# Patient Record
Sex: Female | Born: 2000 | Race: Black or African American | Hispanic: No | Marital: Single | Smoking: Never smoker
Health system: Southern US, Community
[De-identification: ages and names within clinical notes are randomized; demographics above are authoritative.]

## PROBLEM LIST (undated history)

## (undated) DIAGNOSIS — Z8759 Personal history of other complications of pregnancy, childbirth and the puerperium: Secondary | ICD-10-CM

## (undated) DIAGNOSIS — R87629 Unspecified abnormal cytological findings in specimens from vagina: Secondary | ICD-10-CM

## (undated) DIAGNOSIS — O09299 Supervision of pregnancy with other poor reproductive or obstetric history, unspecified trimester: Secondary | ICD-10-CM

## (undated) HISTORY — DX: Supervision of pregnancy with other poor reproductive or obstetric history, unspecified trimester: O09.299

## (undated) HISTORY — DX: Personal history of other complications of pregnancy, childbirth and the puerperium: Z87.59

## (undated) HISTORY — PX: WISDOM TOOTH EXTRACTION: SHX21

---
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
## 2018-05-22 ENCOUNTER — Emergency Department (HOSPITAL_COMMUNITY)
Admission: EM | Admit: 2018-05-22 | Discharge: 2018-05-22 | Disposition: A | Discharge status: Home / Self Care | Payer: Self-pay | Attending: Emergency Medicine | Admitting: Emergency Medicine

## 2018-05-22 ENCOUNTER — Encounter (HOSPITAL_COMMUNITY): Payer: Self-pay | Admitting: Emergency Medicine

## 2018-05-22 DIAGNOSIS — J020 Streptococcal pharyngitis: Secondary | ICD-10-CM | POA: Insufficient documentation

## 2018-05-22 DIAGNOSIS — Z79899 Other long term (current) drug therapy: Secondary | ICD-10-CM | POA: Insufficient documentation

## 2018-05-22 LAB — GROUP A STREP BY PCR: Group A Strep by PCR: DETECTED — AB

## 2018-05-22 MED ORDER — PREDNISONE 20 MG PO TABS: 40.0000 mg | ORAL_TABLET | Freq: Every day | ORAL | 0 refills | Status: AC

## 2018-05-22 MED ORDER — PENICILLIN V POTASSIUM 500 MG PO TABS: 500.0000 mg | ORAL_TABLET | Freq: Two times a day (BID) | ORAL | 0 refills | Status: AC

## 2018-05-22 NOTE — ED Triage Notes (Signed)
Per pt, states tonsils swollen for 2-3 days-has not seen PCP

## 2018-05-22 NOTE — Discharge Instructions (Addendum)
Please take Ibuprofen (Advil, motrin) and Tylenol (acetaminophen) to relieve your pain.  You may take up to 600 MG (3 pills) of normal strength ibuprofen every 8 hours as needed.  In between doses of ibuprofen you make take tylenol, up to 1,000 mg (two extra strength pills).  Do not take more than 3,000 mg tylenol in a 24 hour period.  Please check all medication labels as many medications such as pain and cold medications may contain tylenol.  Do not drink alcohol while taking these medications.  Do not take other NSAID'S while taking ibuprofen (such as aleve or naproxen).  Please take ibuprofen with food to decrease stomach upset.  You may have diarrhea from the antibiotics.  It is very important that you continue to take the antibiotics even if you get diarrhea unless a medical professional tells you that you may stop taking them.  If you stop too early the bacteria you are being treated for will become stronger and you may need different, more powerful antibiotics that have more side effects and worsening diarrhea.  Please stay well hydrated and consider probiotics as they may decrease the severity of your diarrhea.  Please be aware that if you take any hormonal contraception (birth control pills, nexplanon, the ring, etc) that your birth control will not work while you are taking antibiotics and you need to use back up protection as directed on the birth control medication information insert.   I have given you a prescription for steroids today.  Some common side effects include feelings of extra energy, feeling warm, increased appetite, and stomach upset.  If you are diabetic your sugars may run higher than usual.

## 2018-05-22 NOTE — ED Provider Notes (Signed)
[facility]-EMERGENCY DEPT Provider Note   CSN: [id number] Arrival date & time: 05/22/18  1251     History   Chief Complaint Chief Complaint  Patient presents with  . tonsils swollen    HPI Deborah Santiago is a 17 y.o. female who presents today for evlaution of three days of sore throat, and feeling like her tonsils are swollen.  Her temperature this afternoon was 100.2.  She has been taking ibuprofen, last dose yesterday.  Her son has an ear infection.    HPI  History reviewed. No pertinent past medical history.  Patient Active Problem List   Diagnosis Date Noted  . S/P primary low transverse C-section 10/29/2015  . Eclampsia complicating pregnancy in third trimester 10/28/2015    Past Surgical History:  Procedure Laterality Date  . CESAREAN SECTION N/A 10/28/2015   Procedure: CESAREAN SECTION;  Surgeon: Huel CoteKathy Richardson, MD;  [facility];  Service: Obstetrics;  Laterality: N/A;     OB History    Gravida  1   Para  1   Term      Preterm  1   AB      Living  1     SAB      TAB      Ectopic      Multiple  0   Live Births  1            Home Medications    Prior to Admission medications   Medication Sig Start Date End Date Taking? Authorizing Provider  acetaminophen (TYLENOL) 500 MG tablet Take 1,000 mg by mouth every 6 (six) hours as needed for moderate pain.    [provider]  albuterol (PROVENTIL HFA;VENTOLIN HFA) 108 (90 Base) MCG/ACT inhaler Inhale 1-2 puffs into the lungs every 6 (six) hours as needed for wheezing or shortness of breath.    [provider]  cetirizine (ZYRTEC) 10 MG tablet Take 10 mg by mouth daily.    [provider]  ibuprofen (ADVIL) 200 MG tablet Take 3 tablets (600 mg total) by mouth every 6 (six) hours as needed for mild pain. 11/01/15   Huel Coteichardson, Kathy, MD  oxyCODONE (OXY IR/ROXICODONE) 5 MG immediate release tablet Take 1 tablet (5 mg total) by  mouth every 4 (four) hours as needed (pain scale 4-7). 11/01/15   Huel Coteichardson, Kathy, MD  penicillin v potassium (VEETID) 500 MG tablet Take 1 tablet (500 mg total) by mouth 2 (two) times daily for 10 days. 05/22/18 06/01/18  Cristina GongHammond, Loi Rennaker W, PA-C  predniSONE (DELTASONE) 20 MG tablet Take 2 tablets (40 mg total) by mouth daily for 3 days. 05/22/18 05/25/18  Cristina GongHammond, Shavon Zenz W, PA-C  Prenatal Vit-Fe Fumarate-FA (PRENATAL MULTIVITAMIN) TABS tablet Take 1 tablet by mouth daily at 12 noon.    [provider]    Family History No family history on file.  Social History Social History   Tobacco Use  . Smoking status: Never Smoker  Substance Use Topics  . Alcohol use: Yes  . Drug use: No     Allergies   Patient has no known allergies.   Review of Systems Review of Systems  Constitutional: Positive for chills, fatigue and fever.  HENT: Positive for sore throat. Negative for congestion, ear pain, postnasal drip, rhinorrhea, sinus pressure, sinus pain, trouble swallowing and voice change.   Respiratory: Negative for cough, chest tightness and shortness of breath.   Cardiovascular: Negative for chest pain and palpitations.  Musculoskeletal: Negative for  neck pain and neck stiffness.  Neurological: Negative for weakness and headaches.  All other systems reviewed and are negative.    Physical Exam Updated Vital Signs BP 132/74   Pulse 100   Temp 99.4 F (37.4 C) (Oral)   Resp 16   Ht 5\' 3"  (1.6 m)   Wt 68 kg   LMP 05/15/2018   SpO2 98%   BMI 26.57 kg/m   Physical Exam Vitals signs and nursing note reviewed.  Constitutional:      General: She is not in acute distress.    Appearance: She is normal weight. She is not ill-appearing or toxic-appearing.  HENT:     Head: Normocephalic.     Jaw: There is normal jaw occlusion.     Right Ear: Tympanic membrane, ear canal and external ear normal. There is no impacted cerumen.     Left Ear: Tympanic membrane, ear canal and  external ear normal. There is no impacted cerumen.     Mouth/Throat:     Mouth: Mucous membranes are moist.     Palate: No mass and lesions. Palate does not elevate in midline.     Pharynx: Uvula midline. Oropharyngeal exudate and posterior oropharyngeal erythema present. No uvula swelling.     Tonsils: Tonsillar exudate present. No tonsillar abscesses. Swelling: 2+ on the right. 2+ on the left.  Neck:     Musculoskeletal: Normal range of motion.  Cardiovascular:     Rate and Rhythm: Tachycardia present.     Heart sounds: Normal heart sounds.  Pulmonary:     Effort: Pulmonary effort is normal. No respiratory distress.  Lymphadenopathy:     Cervical: Cervical adenopathy present.  Skin:    General: Skin is warm.  Neurological:     General: No focal deficit present.     Mental Status: She is alert.      ED Treatments / Results  Labs (all labs ordered are listed, but only abnormal results are displayed) Labs Reviewed  GROUP A STREP BY PCR - Abnormal; Notable for the following components:      Result Value   Group A Strep by PCR DETECTED (*)    All other components within normal limits    EKG None  Radiology No results found.  Procedures Procedures (including critical care time)  Medications Ordered in ED Medications - No data to display   Initial Impression / Assessment and Plan / ED Course  I have reviewed the triage vital signs and the nursing notes.  Pertinent labs & imaging results that were available during my care of the patient were reviewed by me and considered in my medical decision making (see chart for details).    Pt febrile with tonsillar exudate, cervical lymphadenopathy, & dysphagia; diagnosis of strep.  Pt appears mildly dehydrated, discussed importance of water rehydration. Presentation non concerning for PTA or infxn spread to soft tissue. No trismus or uvula deviation. Specific return precautions discussed. Pt able to drink water in ED without  difficulty with intact air way. Recommended PCP follow up.    Final Clinical Impressions(s) / ED Diagnoses   Final diagnoses:  Strep throat    ED Discharge Orders         Ordered    penicillin v potassium (VEETID) 500 MG tablet  2 times daily     05/22/18 1740    predniSONE (DELTASONE) 20 MG tablet  Daily     05/22/18 1740           Jeraldine Loots,  Earnstine Regallizabeth W, PA-C 05/22/18 1746    Azalia Bilisampos, Kevin, MD 05/22/18 2340

## 2018-10-12 ENCOUNTER — Emergency Department (HOSPITAL_COMMUNITY): Payer: Medicaid Other

## 2018-10-12 ENCOUNTER — Encounter (HOSPITAL_COMMUNITY): Payer: Self-pay

## 2018-10-12 ENCOUNTER — Emergency Department (HOSPITAL_COMMUNITY)
Admission: EM | Admit: 2018-10-12 | Discharge: 2018-10-12 | Disposition: A | Discharge status: Home / Self Care | Payer: Medicaid Other | Attending: Emergency Medicine | Admitting: Emergency Medicine

## 2018-10-12 ENCOUNTER — Other Ambulatory Visit: Payer: Self-pay

## 2018-10-12 DIAGNOSIS — Y9389 Activity, other specified: Secondary | ICD-10-CM | POA: Insufficient documentation

## 2018-10-12 DIAGNOSIS — Y929 Unspecified place or not applicable: Secondary | ICD-10-CM | POA: Diagnosis not present

## 2018-10-12 DIAGNOSIS — W010XXA Fall on same level from slipping, tripping and stumbling without subsequent striking against object, initial encounter: Secondary | ICD-10-CM | POA: Diagnosis not present

## 2018-10-12 DIAGNOSIS — Y999 Unspecified external cause status: Secondary | ICD-10-CM | POA: Diagnosis not present

## 2018-10-12 DIAGNOSIS — S6991XA Unspecified injury of right wrist, hand and finger(s), initial encounter: Secondary | ICD-10-CM | POA: Diagnosis present

## 2018-10-12 DIAGNOSIS — Z79899 Other long term (current) drug therapy: Secondary | ICD-10-CM | POA: Diagnosis not present

## 2018-10-12 NOTE — ED Provider Notes (Signed)
[facility]   CSN: [id number] Arrival date & time: 10/12/18  1131    History   Chief Complaint Chief Complaint  Patient presents with  . thumb injury    HPI Deborah Santiago is a 18 y.o. female presenting to the emergency department with right thumb injury that occurred about 5 days ago.  Patient states she fell and her right thumb bent in the dorsal direction.  She has pain to the dorsal aspect of the right first MCP joint that is worse with finger opposition.  She took over-the-counter pain medication on that first day, however has not treated her symptoms since then.  No swelling noted.  No wounds.  Right-hand-dominant.     The history is provided by the patient.    History reviewed. No pertinent past medical history.  Patient Active Problem List   Diagnosis Date Noted  . S/P primary low transverse C-section 10/29/2015  . Eclampsia complicating pregnancy in third trimester 10/28/2015    Past Surgical History:  Procedure Laterality Date  . CESAREAN SECTION N/A 10/28/2015   Procedure: CESAREAN SECTION;  Surgeon: Huel CoteKathy Richardson, MD;  [facility];  Service: Obstetrics;  Laterality: N/A;     OB History    Gravida  1   Para  1   Term      Preterm  1   AB      Living  1     SAB      TAB      Ectopic      Multiple  0   Live Births  1            Home Medications    Prior to Admission medications   Medication Sig Start Date End Date Taking? Authorizing Provider  acetaminophen (TYLENOL) 500 MG tablet Take 1,000 mg by mouth every 6 (six) hours as needed for moderate pain.    [provider]  albuterol (PROVENTIL HFA;VENTOLIN HFA) 108 (90 Base) MCG/ACT inhaler Inhale 1-2 puffs into the lungs every 6 (six) hours as needed for wheezing or shortness of breath.    [provider]  cetirizine (ZYRTEC) 10 MG tablet Take 10 mg by mouth daily.    [provider]   ibuprofen (ADVIL) 200 MG tablet Take 3 tablets (600 mg total) by mouth every 6 (six) hours as needed for mild pain. 11/01/15   Huel Coteichardson, Kathy, MD  KURVELO 0.15-30 MG-MCG tablet Take 1 tablet by mouth daily. 09/08/18   [provider]  oxyCODONE (OXY IR/ROXICODONE) 5 MG immediate release tablet Take 1 tablet (5 mg total) by mouth every 4 (four) hours as needed (pain scale 4-7). 11/01/15   Huel Coteichardson, Kathy, MD  Prenatal Vit-Fe Fumarate-FA (PRENATAL MULTIVITAMIN) TABS tablet Take 1 tablet by mouth daily at 12 noon.    [provider]    Family History Family History  Problem Relation Age of Onset  . Hypertension Mother     Social History Social History   Tobacco Use  . Smoking status: Never Smoker  . Smokeless tobacco: Never Used  Substance Use Topics  . Alcohol use: Yes  . Drug use: No     Allergies   Patient has no known allergies.   Review of Systems Review of Systems  Musculoskeletal: Positive for arthralgias.  Skin: Negative for wound.     Physical Exam Updated Vital Signs BP 126/81 (BP Location: Left Arm)   Pulse 80   Temp 98.9 F (37.2  C) (Oral)   Resp 16   Ht 5\' 3"  (1.6 m)   Wt 65.8 kg   LMP 10/11/2018 (Exact Date)   SpO2 100%   BMI 25.69 kg/m   Physical Exam Vitals signs and nursing note reviewed.  Constitutional:      General: She is not in acute distress.    Appearance: She is well-developed.  HENT:     Head: Normocephalic and atraumatic.  Eyes:     Conjunctiva/sclera: Conjunctivae normal.  Cardiovascular:     Rate and Rhythm: Normal rate.  Pulmonary:     Effort: Pulmonary effort is normal.  Musculoskeletal:     Comments: Right thumb without swelling, bruising, or deformity.  There is some tenderness over the dorsal right first MCP joint.  Full active normal range of motion.  Normal sensation and cap refill.  No anatomical snuffbox tenderness.  Wrist is with normal range of motion.  Neurological:     Mental Status: She is  alert.  Psychiatric:        Mood and Affect: Mood normal.        Behavior: Behavior normal.      ED Treatments / Results  Labs (all labs ordered are listed, but only abnormal results are displayed) Labs Reviewed - No data to display  EKG None  Radiology Dg Finger Thumb Right  Result Date: 10/12/2018 CLINICAL DATA:  Right thumb pain after a fall last week in. EXAM: RIGHT THUMB 2+V COMPARISON:  None. FINDINGS: There is no evidence of fracture or dislocation. There is no evidence of arthropathy or other focal bone abnormality. Soft tissues are unremarkable IMPRESSION: Negative. Electronically Signed   By: Sebastian Ache M.D.   On: 10/12/2018 12:20    Procedures Procedures (including critical care time)  Medications Ordered in ED Medications - No data to display   Initial Impression / Assessment and Plan / ED Course  I have reviewed the triage vital signs and the nursing notes.  Pertinent labs & imaging results that were available during my care of the patient were reviewed by me and considered in my medical decision making (see chart for details).        Patient with right thumb injury after mechanical fall about 5 days ago.  Likely sprain versus strain.  No fracture evident on x-ray.  Will splint for comfort and protection.  Instructed RICE therapy.  Follow-up as needed symptoms persist.  Patient agreeable to plan is a for discharge.  Discussed results, findings, treatment and follow up. Patient advised of return precautions. Patient verbalized understanding and agreed with plan.   Final Clinical Impressions(s) / ED Diagnoses   Final diagnoses:  Thumb injury, right, initial encounter    ED Discharge Orders    None       , [place] N, PA-C 10/12/18 1237    Gerhard Munch, MD 10/13/18 [phone]

## 2018-10-12 NOTE — Discharge Instructions (Addendum)
Please read instructions below. Apply ice to your thumb for 20 minutes at a time. You can take ibuprofen every 6 hours as needed for pain. Schedule an appointment with the sports medicine doctor in 2 weeks if symptoms do not improve.  Return to the ER for new or concerning symptoms.

## 2018-10-12 NOTE — ED Notes (Signed)
Patient transported to X-ray 

## 2018-10-12 NOTE — ED Triage Notes (Signed)
Patient states she "fell on her right thumb last weekend and the thumb bent all the way back."

## 2019-01-23 LAB — OB RESULTS CONSOLE ANTIBODY SCREEN: Antibody Screen: NEGATIVE

## 2019-01-23 LAB — OB RESULTS CONSOLE HIV ANTIBODY (ROUTINE TESTING): HIV: NONREACTIVE

## 2019-01-23 LAB — OB RESULTS CONSOLE ABO/RH: RH Type: POSITIVE

## 2019-01-23 LAB — OB RESULTS CONSOLE RPR: RPR: NONREACTIVE

## 2019-01-23 LAB — OB RESULTS CONSOLE RUBELLA ANTIBODY, IGM: Rubella: IMMUNE

## 2019-01-23 LAB — OB RESULTS CONSOLE GC/CHLAMYDIA
Chlamydia: NEGATIVE
Gonorrhea: NEGATIVE

## 2019-01-23 LAB — OB RESULTS CONSOLE HEPATITIS B SURFACE ANTIGEN: Hepatitis B Surface Ag: NEGATIVE

## 2019-05-28 ENCOUNTER — Ambulatory Visit: Payer: Medicaid Other | Attending: Internal Medicine

## 2019-05-28 DIAGNOSIS — Z20822 Contact with and (suspected) exposure to covid-19: Secondary | ICD-10-CM

## 2019-05-29 LAB — NOVEL CORONAVIRUS, NAA: SARS-CoV-2, NAA: NOT DETECTED

## 2019-07-04 LAB — OB RESULTS CONSOLE GBS: GBS: POSITIVE

## 2019-08-02 ENCOUNTER — Encounter (HOSPITAL_COMMUNITY): Payer: Self-pay | Admitting: *Deleted

## 2019-08-02 ENCOUNTER — Telehealth (HOSPITAL_COMMUNITY): Payer: Self-pay | Admitting: *Deleted

## 2019-08-02 NOTE — Telephone Encounter (Signed)
Preadmission screen  

## 2019-08-08 ENCOUNTER — Other Ambulatory Visit (HOSPITAL_COMMUNITY)
Admission: RE | Admit: 2019-08-08 | Discharge: 2019-08-08 | Disposition: A | Discharge status: Home / Self Care | Payer: Medicaid Other | Source: Ambulatory Visit | Attending: Family Medicine | Admitting: Family Medicine

## 2019-08-08 DIAGNOSIS — Z01812 Encounter for preprocedural laboratory examination: Secondary | ICD-10-CM | POA: Diagnosis not present

## 2019-08-08 DIAGNOSIS — Z20822 Contact with and (suspected) exposure to covid-19: Secondary | ICD-10-CM | POA: Insufficient documentation

## 2019-08-08 LAB — SARS CORONAVIRUS 2 (TAT 6-24 HRS): SARS Coronavirus 2: NEGATIVE

## 2019-08-10 ENCOUNTER — Inpatient Hospital Stay (HOSPITAL_COMMUNITY)
Admission: AD | Admit: 2019-08-10 | Discharge: 2019-08-13 | DRG: 788 | Disposition: A | Discharge status: Home / Self Care | Payer: Medicaid Other | Attending: Obstetrics & Gynecology | Admitting: Obstetrics & Gynecology

## 2019-08-10 ENCOUNTER — Inpatient Hospital Stay (HOSPITAL_COMMUNITY): Payer: Medicaid Other

## 2019-08-10 ENCOUNTER — Other Ambulatory Visit: Payer: Self-pay | Admitting: Obstetrics & Gynecology

## 2019-08-10 ENCOUNTER — Other Ambulatory Visit: Payer: Self-pay

## 2019-08-10 ENCOUNTER — Inpatient Hospital Stay (HOSPITAL_COMMUNITY): Payer: Medicaid Other | Admitting: Anesthesiology

## 2019-08-10 ENCOUNTER — Encounter (HOSPITAL_COMMUNITY)
Admission: AD | Disposition: A | Discharge status: Home / Self Care | Payer: Self-pay | Source: Home / Self Care | Attending: Obstetrics & Gynecology

## 2019-08-10 ENCOUNTER — Encounter (HOSPITAL_COMMUNITY): Payer: Self-pay | Admitting: Obstetrics & Gynecology

## 2019-08-10 DIAGNOSIS — O480 Post-term pregnancy: Secondary | ICD-10-CM | POA: Diagnosis present

## 2019-08-10 DIAGNOSIS — O323XX0 Maternal care for face, brow and chin presentation, not applicable or unspecified: Secondary | ICD-10-CM | POA: Diagnosis present

## 2019-08-10 DIAGNOSIS — Z3490 Encounter for supervision of normal pregnancy, unspecified, unspecified trimester: Secondary | ICD-10-CM

## 2019-08-10 DIAGNOSIS — O34211 Maternal care for low transverse scar from previous cesarean delivery: Secondary | ICD-10-CM | POA: Diagnosis present

## 2019-08-10 DIAGNOSIS — Z880 Allergy status to penicillin: Secondary | ICD-10-CM

## 2019-08-10 DIAGNOSIS — Z3A41 41 weeks gestation of pregnancy: Secondary | ICD-10-CM

## 2019-08-10 DIAGNOSIS — O99824 Streptococcus B carrier state complicating childbirth: Secondary | ICD-10-CM | POA: Diagnosis present

## 2019-08-10 DIAGNOSIS — O9902 Anemia complicating childbirth: Secondary | ICD-10-CM | POA: Diagnosis present

## 2019-08-10 DIAGNOSIS — D649 Anemia, unspecified: Secondary | ICD-10-CM | POA: Diagnosis present

## 2019-08-10 HISTORY — DX: Encounter for supervision of normal pregnancy, unspecified, unspecified trimester: Z34.90

## 2019-08-10 LAB — RPR: RPR Ser Ql: NONREACTIVE

## 2019-08-10 LAB — CBC
HCT: 38.4 % (ref 36.0–46.0)
Hemoglobin: 12.3 g/dL (ref 12.0–15.0)
MCH: 24.8 pg — ABNORMAL LOW (ref 26.0–34.0)
MCHC: 32 g/dL (ref 30.0–36.0)
MCV: 77.6 fL — ABNORMAL LOW (ref 80.0–100.0)
Platelets: 256 10*3/uL (ref 150–400)
RBC: 4.95 MIL/uL (ref 3.87–5.11)
RDW: 16.2 % — ABNORMAL HIGH (ref 11.5–15.5)
WBC: 7.3 10*3/uL (ref 4.0–10.5)
nRBC: 0 % (ref 0.0–0.2)

## 2019-08-10 LAB — TYPE AND SCREEN
ABO/RH(D): B POS
Antibody Screen: NEGATIVE

## 2019-08-10 LAB — ABO/RH: ABO/RH(D): B POS

## 2019-08-10 SURGERY — Surgical Case
Anesthesia: Epidural | Site: Abdomen | Wound class: Clean Contaminated

## 2019-08-10 MED ORDER — ACETAMINOPHEN 325 MG PO TABS: 325.0000 mg | ORAL_TABLET | ORAL | Status: DC | PRN

## 2019-08-10 MED ORDER — SOD CITRATE-CITRIC ACID 500-334 MG/5ML PO SOLN: 30.0000 mL | ORAL | Status: DC

## 2019-08-10 MED ORDER — OXYTOCIN 40 UNITS IN NORMAL SALINE INFUSION - SIMPLE MED: 1.0000 m[IU]/min | INTRAVENOUS | Status: DC

## 2019-08-10 MED ORDER — FENTANYL CITRATE (PF) 100 MCG/2ML IJ SOLN: 25.0000 ug | INTRAMUSCULAR | Status: DC | PRN

## 2019-08-10 MED ORDER — LIDOCAINE-EPINEPHRINE (PF) 2 %-1:200000 IJ SOLN
INTRAMUSCULAR | Status: AC
  Filled 2019-08-10: qty 10

## 2019-08-10 MED ORDER — PHENYLEPHRINE 40 MCG/ML (10ML) SYRINGE FOR IV PUSH (FOR BLOOD PRESSURE SUPPORT): 80.0000 ug | PREFILLED_SYRINGE | INTRAVENOUS | Status: DC | PRN

## 2019-08-10 MED ORDER — LIDOCAINE-EPINEPHRINE (PF) 2 %-1:200000 IJ SOLN
INTRAMUSCULAR | Status: DC | PRN
  Administered 2019-08-10: 10 mL via EPIDURAL

## 2019-08-10 MED ORDER — DIPHENHYDRAMINE HCL 50 MG/ML IJ SOLN: 12.5000 mg | INTRAMUSCULAR | Status: DC | PRN

## 2019-08-10 MED ORDER — OXYTOCIN BOLUS FROM INFUSION: 500.0000 mL | Freq: Once | INTRAVENOUS | Status: DC

## 2019-08-10 MED ORDER — DEXAMETHASONE SODIUM PHOSPHATE 10 MG/ML IJ SOLN
INTRAMUSCULAR | Status: AC
  Filled 2019-08-10: qty 1

## 2019-08-10 MED ORDER — OXYTOCIN 40 UNITS IN NORMAL SALINE INFUSION - SIMPLE MED
INTRAVENOUS | Status: DC | PRN
  Administered 2019-08-10: 200 mL via INTRAVENOUS

## 2019-08-10 MED ORDER — SOD CITRATE-CITRIC ACID 500-334 MG/5ML PO SOLN
30.0000 mL | ORAL | Status: DC | PRN
  Filled 2019-08-10: qty 30

## 2019-08-10 MED ORDER — MEPERIDINE HCL 25 MG/ML IJ SOLN: 6.2500 mg | INTRAMUSCULAR | Status: DC | PRN

## 2019-08-10 MED ORDER — LIDOCAINE HCL (PF) 1 % IJ SOLN: 30.0000 mL | INTRAMUSCULAR | Status: DC | PRN

## 2019-08-10 MED ORDER — SODIUM CHLORIDE (PF) 0.9 % IJ SOLN
INTRAMUSCULAR | Status: DC | PRN
  Administered 2019-08-10: 12 mL/h via EPIDURAL

## 2019-08-10 MED ORDER — FENTANYL CITRATE (PF) 100 MCG/2ML IJ SOLN
INTRAMUSCULAR | Status: DC | PRN
  Administered 2019-08-10: 100 ug via EPIDURAL

## 2019-08-10 MED ORDER — CEFAZOLIN SODIUM-DEXTROSE 2-4 GM/100ML-% IV SOLN
2.0000 g | INTRAVENOUS | Status: AC
  Administered 2019-08-10: 2 g via INTRAVENOUS

## 2019-08-10 MED ORDER — EPHEDRINE 5 MG/ML INJ: 10.0000 mg | INTRAVENOUS | Status: DC | PRN

## 2019-08-10 MED ORDER — DIBUCAINE (PERIANAL) 1 % EX OINT: 1.0000 "application " | TOPICAL_OINTMENT | CUTANEOUS | Status: DC | PRN

## 2019-08-10 MED ORDER — ONDANSETRON HCL 4 MG/2ML IJ SOLN: 4.0000 mg | Freq: Once | INTRAMUSCULAR | Status: DC | PRN

## 2019-08-10 MED ORDER — OXYCODONE-ACETAMINOPHEN 5-325 MG PO TABS: 1.0000 | ORAL_TABLET | ORAL | Status: DC | PRN

## 2019-08-10 MED ORDER — FENTANYL-BUPIVACAINE-NACL 0.5-0.125-0.9 MG/250ML-% EP SOLN
12.0000 mL/h | EPIDURAL | Status: DC | PRN
  Filled 2019-08-10: qty 250

## 2019-08-10 MED ORDER — OXYCODONE HCL 5 MG PO TABS: 5.0000 mg | ORAL_TABLET | Freq: Once | ORAL | Status: DC | PRN

## 2019-08-10 MED ORDER — DIPHENHYDRAMINE HCL 25 MG PO CAPS: 25.0000 mg | ORAL_CAPSULE | Freq: Four times a day (QID) | ORAL | Status: DC | PRN

## 2019-08-10 MED ORDER — TERBUTALINE SULFATE 1 MG/ML IJ SOLN: 0.2500 mg | Freq: Once | INTRAMUSCULAR | Status: DC | PRN

## 2019-08-10 MED ORDER — LACTATED RINGERS IV SOLN: INTRAVENOUS | Status: DC

## 2019-08-10 MED ORDER — MENTHOL 3 MG MT LOZG: 1.0000 | LOZENGE | OROMUCOSAL | Status: DC | PRN

## 2019-08-10 MED ORDER — ACETAMINOPHEN 325 MG PO TABS: 650.0000 mg | ORAL_TABLET | ORAL | Status: DC | PRN

## 2019-08-10 MED ORDER — ACETAMINOPHEN 325 MG PO TABS
650.0000 mg | ORAL_TABLET | ORAL | Status: DC | PRN
  Administered 2019-08-13: 650 mg via ORAL
  Filled 2019-08-10: qty 2

## 2019-08-10 MED ORDER — ONDANSETRON HCL 4 MG/2ML IJ SOLN
INTRAMUSCULAR | Status: DC | PRN
  Administered 2019-08-10: 4 mg via INTRAVENOUS

## 2019-08-10 MED ORDER — PHENYLEPHRINE 40 MCG/ML (10ML) SYRINGE FOR IV PUSH (FOR BLOOD PRESSURE SUPPORT)
80.0000 ug | PREFILLED_SYRINGE | INTRAVENOUS | Status: DC | PRN
  Filled 2019-08-10: qty 10

## 2019-08-10 MED ORDER — OXYCODONE HCL 5 MG PO TABS
5.0000 mg | ORAL_TABLET | ORAL | Status: DC | PRN
  Administered 2019-08-12 – 2019-08-13 (×6): 5 mg via ORAL
  Filled 2019-08-10 (×6): qty 1

## 2019-08-10 MED ORDER — OXYTOCIN 40 UNITS IN NORMAL SALINE INFUSION - SIMPLE MED
1.0000 m[IU]/min | INTRAVENOUS | Status: DC
  Administered 2019-08-10: 3 m[IU]/min via INTRAVENOUS
  Administered 2019-08-10: 4 m[IU]/min via INTRAVENOUS
  Administered 2019-08-10: 2 m[IU]/min via INTRAVENOUS
  Administered 2019-08-10: 3 m[IU]/min via INTRAVENOUS
  Administered 2019-08-10: 2 m[IU]/min via INTRAVENOUS

## 2019-08-10 MED ORDER — SENNOSIDES-DOCUSATE SODIUM 8.6-50 MG PO TABS
2.0000 | ORAL_TABLET | ORAL | Status: DC
  Administered 2019-08-12 (×2): 2 via ORAL
  Filled 2019-08-10 (×3): qty 2

## 2019-08-10 MED ORDER — OXYTOCIN 40 UNITS IN NORMAL SALINE INFUSION - SIMPLE MED
1.0000 m[IU]/min | INTRAVENOUS | Status: DC
  Administered 2019-08-10: 1 m[IU]/min via INTRAVENOUS
  Filled 2019-08-10: qty 1000

## 2019-08-10 MED ORDER — OXYCODONE HCL 5 MG/5ML PO SOLN: 5.0000 mg | Freq: Once | ORAL | Status: DC | PRN

## 2019-08-10 MED ORDER — CEFAZOLIN SODIUM-DEXTROSE 2-4 GM/100ML-% IV SOLN
INTRAVENOUS | Status: AC
  Filled 2019-08-10: qty 100

## 2019-08-10 MED ORDER — KETOROLAC TROMETHAMINE 30 MG/ML IJ SOLN
INTRAMUSCULAR | Status: AC
  Filled 2019-08-10: qty 1

## 2019-08-10 MED ORDER — OXYTOCIN 40 UNITS IN NORMAL SALINE INFUSION - SIMPLE MED: 2.5000 [IU]/h | INTRAVENOUS | Status: AC

## 2019-08-10 MED ORDER — SIMETHICONE 80 MG PO CHEW: 80.0000 mg | CHEWABLE_TABLET | ORAL | Status: DC | PRN

## 2019-08-10 MED ORDER — WITCH HAZEL-GLYCERIN EX PADS: 1.0000 "application " | MEDICATED_PAD | CUTANEOUS | Status: DC | PRN

## 2019-08-10 MED ORDER — PRENATAL MULTIVITAMIN CH
1.0000 | ORAL_TABLET | Freq: Every day | ORAL | Status: DC
  Administered 2019-08-11 – 2019-08-12 (×2): 1 via ORAL
  Filled 2019-08-10 (×2): qty 1

## 2019-08-10 MED ORDER — BUPIVACAINE HCL 0.5 % IJ SOLN
INTRAMUSCULAR | Status: DC | PRN
  Administered 2019-08-10: 18 mL

## 2019-08-10 MED ORDER — COCONUT OIL OIL
1.0000 "application " | TOPICAL_OIL | Status: DC | PRN
  Administered 2019-08-13: 1 via TOPICAL

## 2019-08-10 MED ORDER — BUPIVACAINE HCL (PF) 0.5 % IJ SOLN
INTRAMUSCULAR | Status: AC
  Filled 2019-08-10: qty 30

## 2019-08-10 MED ORDER — MORPHINE SULFATE (PF) 0.5 MG/ML IJ SOLN
INTRAMUSCULAR | Status: DC | PRN
  Administered 2019-08-10: 3 mg via EPIDURAL

## 2019-08-10 MED ORDER — TETANUS-DIPHTH-ACELL PERTUSSIS 5-2.5-18.5 LF-MCG/0.5 IM SUSP: 0.5000 mL | Freq: Once | INTRAMUSCULAR | Status: DC

## 2019-08-10 MED ORDER — OXYTOCIN 40 UNITS IN NORMAL SALINE INFUSION - SIMPLE MED: 2.5000 [IU]/h | INTRAVENOUS | Status: DC

## 2019-08-10 MED ORDER — IBUPROFEN 600 MG PO TABS
600.0000 mg | ORAL_TABLET | Freq: Four times a day (QID) | ORAL | Status: DC
  Administered 2019-08-11 – 2019-08-13 (×9): 600 mg via ORAL
  Filled 2019-08-10 (×9): qty 1

## 2019-08-10 MED ORDER — DEXAMETHASONE SODIUM PHOSPHATE 10 MG/ML IJ SOLN
INTRAMUSCULAR | Status: DC | PRN
  Administered 2019-08-10: 10 mg via INTRAVENOUS

## 2019-08-10 MED ORDER — SIMETHICONE 80 MG PO CHEW
80.0000 mg | CHEWABLE_TABLET | ORAL | Status: DC
  Administered 2019-08-12 (×2): 80 mg via ORAL
  Filled 2019-08-10 (×3): qty 1

## 2019-08-10 MED ORDER — OXYCODONE-ACETAMINOPHEN 5-325 MG PO TABS: 2.0000 | ORAL_TABLET | ORAL | Status: DC | PRN

## 2019-08-10 MED ORDER — LACTATED RINGERS IV SOLN: 500.0000 mL | Freq: Once | INTRAVENOUS | Status: DC

## 2019-08-10 MED ORDER — SIMETHICONE 80 MG PO CHEW
80.0000 mg | CHEWABLE_TABLET | Freq: Three times a day (TID) | ORAL | Status: DC
  Administered 2019-08-11 – 2019-08-13 (×7): 80 mg via ORAL
  Filled 2019-08-10 (×7): qty 1

## 2019-08-10 MED ORDER — FENTANYL CITRATE (PF) 100 MCG/2ML IJ SOLN
INTRAMUSCULAR | Status: AC
  Filled 2019-08-10: qty 2

## 2019-08-10 MED ORDER — ONDANSETRON HCL 4 MG/2ML IJ SOLN
INTRAMUSCULAR | Status: AC
  Filled 2019-08-10: qty 2

## 2019-08-10 MED ORDER — LIDOCAINE HCL (PF) 1 % IJ SOLN
INTRAMUSCULAR | Status: DC | PRN
  Administered 2019-08-10: 5 mL via EPIDURAL
  Administered 2019-08-10: 3 mL via EPIDURAL
  Administered 2019-08-10: 2 mL via EPIDURAL

## 2019-08-10 MED ORDER — SODIUM CHLORIDE 0.9 % IV SOLN
5.0000 10*6.[IU] | Freq: Once | INTRAVENOUS | Status: AC
  Administered 2019-08-10: 5 10*6.[IU] via INTRAVENOUS
  Filled 2019-08-10: qty 5

## 2019-08-10 MED ORDER — PENICILLIN G POT IN DEXTROSE 60000 UNIT/ML IV SOLN
3.0000 10*6.[IU] | INTRAVENOUS | Status: DC
  Administered 2019-08-10 (×2): 3 10*6.[IU] via INTRAVENOUS
  Filled 2019-08-10 (×2): qty 50

## 2019-08-10 MED ORDER — ONDANSETRON HCL 4 MG/2ML IJ SOLN: 4.0000 mg | Freq: Four times a day (QID) | INTRAMUSCULAR | Status: DC | PRN

## 2019-08-10 MED ORDER — ACETAMINOPHEN 160 MG/5ML PO SOLN: 325.0000 mg | ORAL | Status: DC | PRN

## 2019-08-10 MED ORDER — LACTATED RINGERS IV SOLN: 500.0000 mL | INTRAVENOUS | Status: DC | PRN

## 2019-08-10 MED ORDER — KETOROLAC TROMETHAMINE 30 MG/ML IJ SOLN
30.0000 mg | Freq: Once | INTRAMUSCULAR | Status: AC
  Administered 2019-08-10: 30 mg via INTRAVENOUS

## 2019-08-10 MED ORDER — ZOLPIDEM TARTRATE 5 MG PO TABS: 5.0000 mg | ORAL_TABLET | Freq: Every evening | ORAL | Status: DC | PRN

## 2019-08-10 MED ORDER — MORPHINE SULFATE (PF) 0.5 MG/ML IJ SOLN
INTRAMUSCULAR | Status: AC
  Filled 2019-08-10: qty 10

## 2019-08-10 SURGICAL SUPPLY — 39 items
BENZOIN TINCTURE PRP APPL 2/3 (GAUZE/BANDAGES/DRESSINGS) ×3 IMPLANT
CHLORAPREP W/TINT 26ML (MISCELLANEOUS) ×3 IMPLANT
CLAMP CORD UMBIL (MISCELLANEOUS) IMPLANT
CLOSURE WOUND 1/2 X4 (GAUZE/BANDAGES/DRESSINGS) ×1
CLOTH BEACON ORANGE TIMEOUT ST (SAFETY) ×3 IMPLANT
DRSG OPSITE POSTOP 4X10 (GAUZE/BANDAGES/DRESSINGS) ×3 IMPLANT
ELECT REM PT RETURN 9FT ADLT (ELECTROSURGICAL) ×3
ELECTRODE REM PT RTRN 9FT ADLT (ELECTROSURGICAL) ×1 IMPLANT
EXTRACTOR VACUUM KIWI (MISCELLANEOUS) IMPLANT
GLOVE BIO SURGEON STRL SZ 6.5 (GLOVE) ×2 IMPLANT
GLOVE BIO SURGEONS STRL SZ 6.5 (GLOVE) ×1
GLOVE BIOGEL PI IND STRL 7.0 (GLOVE) ×2 IMPLANT
GLOVE BIOGEL PI INDICATOR 7.0 (GLOVE) ×12
GOWN STRL REUS W/TWL LRG LVL3 (GOWN DISPOSABLE) ×9 IMPLANT
KIT ABG SYR 3ML LUER SLIP (SYRINGE) ×2 IMPLANT
NDL HYPO 25X5/8 SAFETYGLIDE (NEEDLE) IMPLANT
NEEDLE HYPO 22GX1.5 SAFETY (NEEDLE) IMPLANT
NEEDLE HYPO 25X5/8 SAFETYGLIDE (NEEDLE) ×3 IMPLANT
NS IRRIG 1000ML POUR BTL (IV SOLUTION) ×3 IMPLANT
PACK C SECTION WH (CUSTOM PROCEDURE TRAY) ×3 IMPLANT
PAD ABD 8X10 STRL (GAUZE/BANDAGES/DRESSINGS) ×2 IMPLANT
PAD OB MATERNITY 4.3X12.25 (PERSONAL CARE ITEMS) ×3 IMPLANT
PENCIL SMOKE EVAC W/HOLSTER (ELECTROSURGICAL) ×3 IMPLANT
RTRCTR C-SECT PINK 25CM LRG (MISCELLANEOUS) ×3 IMPLANT
SPONGE GAUZE 4X4 12PLY (GAUZE/BANDAGES/DRESSINGS) ×2 IMPLANT
STRIP CLOSURE SKIN 1/2X4 (GAUZE/BANDAGES/DRESSINGS) ×2 IMPLANT
SUT CHROMIC 2 0 CT 1 (SUTURE) ×6 IMPLANT
SUT MNCRL 0 VIOLET CTX 36 (SUTURE) ×2 IMPLANT
SUT MONOCRYL 0 CTX 36 (SUTURE) ×4
SUT PDS AB 0 CTX 60 (SUTURE) ×3 IMPLANT
SUT PLAIN 2 0 (SUTURE) ×2
SUT PLAIN ABS 2-0 CT1 27XMFL (SUTURE) IMPLANT
SUT VIC AB 0 CTX 36 (SUTURE) ×4
SUT VIC AB 0 CTX36XBRD ANBCTRL (SUTURE) ×2 IMPLANT
SUT VIC AB 4-0 KS 27 (SUTURE) ×3 IMPLANT
SYR CONTROL 10ML LL (SYRINGE) IMPLANT
TOWEL OR 17X24 6PK STRL BLUE (TOWEL DISPOSABLE) ×3 IMPLANT
TRAY FOLEY W/BAG SLVR 14FR LF (SET/KITS/TRAYS/PACK) ×3 IMPLANT
WATER STERILE IRR 1000ML POUR (IV SOLUTION) ×3 IMPLANT

## 2019-08-10 NOTE — Anesthesia Preprocedure Evaluation (Signed)
Anesthesia Evaluation  Patient identified by MRN, date of birth, ID band Patient awake    Reviewed: Allergy & Precautions, NPO status , Patient's Chart, lab work & pertinent test results  Airway Mallampati: II  TM Distance: >3 FB Neck ROM: Full    Dental  (+) Teeth Intact, Dental Advisory Given   Pulmonary neg pulmonary ROS,    Pulmonary exam normal breath sounds clear to auscultation       Cardiovascular negative cardio ROS Normal cardiovascular exam Rhythm:Regular Rate:Normal     Neuro/Psych Seizures -,     GI/Hepatic negative GI ROS, Neg liver ROS,   Endo/Other  negative endocrine ROSObesity   Renal/GU negative Renal ROS     Musculoskeletal negative musculoskeletal ROS (+)   Abdominal   Peds  Hematology negative hematology ROS (+)   Anesthesia Other Findings Day of surgery medications reviewed with the patient.  Reproductive/Obstetrics (+) Pregnancy Prior C/s 2/2 HELLP                             Anesthesia Physical Anesthesia Plan  ASA: II  Anesthesia Plan: Epidural   Post-op Pain Management:    Induction:   PONV Risk Score and Plan: 2 and Treatment may vary due to age or medical condition  Airway Management Planned: Natural Airway  Additional Equipment:   Intra-op Plan:   Post-operative Plan:   Informed Consent: I have reviewed the patients History and Physical, chart, labs and discussed the procedure including the risks, benefits and alternatives for the proposed anesthesia with the patient or authorized representative who has indicated his/her understanding and acceptance.     Dental advisory given  Plan Discussed with:   Anesthesia Plan Comments: (Patient identified. Risks/Benefits/Options discussed with patient including but not limited to bleeding, infection, nerve damage, paralysis, failed block, incomplete pain control, headache, blood pressure changes,  nausea, vomiting, reactions to medication both or allergic, itching and postpartum back pain. Confirmed with bedside nurse the patient's most recent platelet count. Confirmed with patient that they are not currently taking any anticoagulation, have any bleeding history or any family history of bleeding disorders. Patient expressed understanding and wished to proceed. All questions were answered. )        Anesthesia Quick Evaluation

## 2019-08-10 NOTE — Op Note (Signed)
Cesarean Section Procedure Note  Indications: previous uterine incision kerr x 1, with face presentation  Pre-operative Diagnosis: 41 week 0 day pregnancy, prior Low transverse cesarean             Section with fetal malpresentation (mentum posterior); Failed TOLAC  Post-operative Diagnosis: same  Surgeon: Caffie Damme, MD  Assistants: Noralyn Pick, CNM  Anesthesia: Epidural anesthesia  ASA Class: 2  Procedure Details   The patient was counseled about the risks, benefits, complications of the cesarean section. The patient concurred with the proposed plan, giving informed consent.  The site of surgery properly noted/marked. The patient was taken to Operating Room # B, identified as CARDELIA SASSANO and the procedure verified as C-Section Delivery. A Time Out was held and the above information confirmed.  After epidural was found to adequate , the patient was placed in the dorsal supine position with a leftward tilt, draped and prepped in the usual sterile manner. A Pfannenstiel incision was made and carried down through the subcutaneous tissue to the fascia.  The fascia was incised in the midline and the fascial incision was extended laterally with Mayo scissors. The superior aspect of the fascial incision was grasped with Coker clamps x2, tented up and the rectus muscles dissected off sharply with the scalpel. The rectus was then dissected off with blunt dissection and Mayo scissors inferiorly. The rectus muscles were separated in the midline. The abdominal peritoneum was identified, tented up between two hemostats, entered sharply using Metzenbaum scissors, and the incision was extended superiorly and inferiorly with good visualization of the bladder. The Alexis retractor was then deployed. The vesicouterine peritoneum was identified, tented up, entered sharply with Metzenbaum scissors, and the bladder flap was created digitally. Scalpel was then used to make a low transverse incision  on the uterus which was extended laterally with  blunt dissection. The fetal face was presenting first through incision, so head was flexed and a Vacuum placed on the occiput.  With fundal pressure given by my assistant to fetal head was delivered (with difficulty) through the incision followed by the body.  A live female infant was bulb suctioned on the operative field cried vigorously, cord was clamped and cut and the infant was passed to the waiting neonatologist. Apgars 8/9. Placenta was then delivered spontaneously, intact and appear normal, the uterus was cleared of all clot and debris. The uterine incision was repaired with #0 Monocryl in running locked fashion. A second imbricating suture was performed using the same suture. The incision was hemostatic.   Ovaries and tubes were inspected and normal. The Alexis retractor was removed. The abdominal cavity was cleared of all clot and debris. The abdominal peritoneum was reapproximated with 2-0 chromic  in a running fashion, the rectus muscles was reapproximated with #2 chromic in interrupted fashion. The fascia was closed with looped 0 PDS in a running fashion. The subcuticular layer was irrigated and all bleeders cauterized.    The incision was injected with 25 mL of 0.5% Marcaine. Interrupted sutures of 2-0 plain were used to re-approximate Scarpas fascia.  The skin was closed with 4-0 vicryl in a subcuticular fashion using a Lanny Hurst needle. The incision was dressed with benzoine, steri strips and pressure dressing. All sponge lap and needle counts were correct x3. Patient tolerated the procedure well and recovered in stable condition following the procedure.  Instrument, sponge, and needle counts were correct prior the abdominal closure and at the conclusion of the case.   Findings: Live  female infant "Luisa Hart (PJ), Apgars 8/9, clear amniotic fluid, normal appearing placenta, normal uterus, bilateral tubes and ovaries  Estimated Blood Loss: 460  mL  IVF: 1500 mL         Drains: Foley catheter  Urine output: 250 mL clear urine         Specimens: Placenta to L&D; cord gases and cord blood         Implants: none         Complications:  None; patient tolerated the procedure well.         Disposition: PACU - hemodynamically stable.   Naoma Diener Keatyn Luck

## 2019-08-10 NOTE — Transfer of Care (Signed)
Immediate Anesthesia Transfer of Care Note  Patient: Deborah Santiago  Procedure(s) Performed: CESAREAN SECTION (N/A Abdomen)  Patient Location: PACU  Anesthesia Type:Epidural  Level of Consciousness: awake, alert  and oriented  Airway & Oxygen Therapy: Patient Spontanous Breathing  Post-op Assessment: Report given to RN and Post -op Vital signs reviewed and stable  Post vital signs: Reviewed and stable  Last Vitals:  Vitals Value Taken Time  BP 103/59 08/10/19 2155  Temp    Pulse 115 08/10/19 2156  Resp 55 08/10/19 2156  SpO2 100 % 08/10/19 2156  Vitals shown include unvalidated device data.  Last Pain:  Vitals:   08/10/19 2015  TempSrc:   PainSc: 0-No pain         Complications: No apparent anesthesia complications

## 2019-08-10 NOTE — Anesthesia Procedure Notes (Signed)
Epidural Patient location during procedure: OB Start time: 08/10/2019 11:06 AM End time: 08/10/2019 11:12 AM  Staffing Anesthesiologist: Cecile Hearing, MD Performed: anesthesiologist   Preanesthetic Checklist Completed: patient identified, IV checked, risks and benefits discussed, monitors and equipment checked, pre-op evaluation and timeout performed  Epidural Patient position: sitting Prep: DuraPrep Patient monitoring: blood pressure and continuous pulse ox Approach: midline Location: L3-L4 Injection technique: LOR air  Needle:  Needle type: Tuohy  Needle gauge: 17 G Needle length: 9 cm Needle insertion depth: 6 cm Catheter size: 19 Gauge Catheter at skin depth: 11 cm Test dose: negative and Other (1% Lidocaine)  Additional Notes Patient identified.  Risk benefits discussed including failed block, incomplete pain control, headache, nerve damage, paralysis, blood pressure changes, nausea, vomiting, reactions to medication both toxic or allergic, and postpartum back pain.  Patient expressed understanding and wished to proceed.  All questions were answered.  Sterile technique used throughout procedure and epidural site dressed with sterile barrier dressing. No paresthesia or other complications noted. The patient did not experience any signs of intravascular injection such as tinnitus or metallic taste in mouth nor signs of intrathecal spread such as rapid motor block. Please see nursing notes for vital signs. Reason for block:procedure for pain

## 2019-08-10 NOTE — Progress Notes (Addendum)
Deborah Santiago is a 19 y.o. G2P0101 at [redacted]w[redacted]d admitted for induction of labor for post term pregnancy,  Subjective: Patient comfortable with epidural.   Objective: BP 107/60   Pulse (!) 107   Temp 98 F (36.7 C) (Oral)   Resp 18   Ht 5\' 3"  (1.6 m)   Wt 82.1 kg   LMP 10/11/2018 (Exact Date)   SpO2 100%   BMI 32.06 kg/m  No intake/output data recorded. Total I/O In: -  Out: 750 [Urine:750]  FHT:  FHR: 120 bpm, variability: moderate,  accelerations:  Present,  decelerations:  Absent UC:   irregular, every 2 to 3  minutes SVE:   Dilation: 5 Effacement (%): 60 Station: -1 Exam by:: Dr [email or web address]  Presentation: Right Mentum Transverse Bedside Ultrasound: Cephalic presentation.  Pitocin: 5 milliunits/minute  Labs: Lab Results  Component Value Date   WBC 7.3 08/10/2019   HGB 12.3 08/10/2019   HCT 38.4 08/10/2019   MCV 77.6 (L) 08/10/2019   PLT 256 08/10/2019    Assessment / Plan: Induction of labor for post term pregnancy  Labor: Face presentation.  Management options discussed, patient desires expectant management for now and reassessment.  She declines cesarean section now.  She understands she will need a cesarean section delivery if no labor progreess or unchanging fetal presentation. Preeclampsia:  None.  Fetal Wellbeing:  Category I Pain Control:  Epidural I/D:  GBS positive on penicillin.  Anticipated MOD:  NSVD Deborah Santiago. MD.  08/10/2019, 5:12 PM

## 2019-08-10 NOTE — Progress Notes (Signed)
Labor Progress Note  Deborah Santiago is a 19 y.o. female, G2P0101, IUP at 2 weeks, presenting for TOLAC IOL for postdates, with h/o Eclampsia in 2017 with stat PCS @33  2/7weeks, last PCR was 0.054 in september. This pregnancy has been uneventfully, with normotensive, currently denies HAS., RUQ pain or vision changes. GBS+. Low risk female. EFW 4.5lbs 1/13 61%.  Subjective: Pt resting bed, getting comfortable post epidural placement. Pt stable, and reviewed R/B/A about pitocin and AROM. Pt verbalized consent for both when indicated. Partner at bedside and supportive.  Patient Active Problem List   Diagnosis Date Noted  . Encounter for induction of labor 08/10/2019  . S/P primary low transverse C-section 10/29/2015  . Eclampsia complicating pregnancy in third trimester 10/28/2015   Objective: BP 116/77   Pulse (!) 108   Temp 98.2 F (36.8 C) (Oral)   Resp 18   Ht 5\' 3"  (1.6 m)   Wt 82.1 kg   LMP 10/11/2018 (Exact Date)   SpO2 100%   BMI 32.06 kg/m  No intake/output data recorded. No intake/output data recorded. NST: FHR baseline 130 bpm, Variability: moderate, Accelerations:present, Decelerations:  Absent= Cat 1/Reactive CTX:  none Uterus gravid, soft non tender, moderate to palpate with contractions.  SVE:  Dilation: 5 Effacement (%): 60 Station: Ballotable Exam by:: erin davis rn Pitocin at 24mUn/min Foley bulb out.  AROM clear, tolerated well Brow presentation noted. performed hands and knees, Cat and cor position with uterine gentle massage to attempt to allow fetus to redirect himself. Pt left on peanut ball.   Assessment:  Deborah NIGHTENGALE is a 19 y.o. female, G2P0101, IUP at 31 weeks, presenting for TOLAC IOL for postdates, with h/o Eclampsia in 2017 with stat PCS @33  2/7weeks, last PCR was 0.054 in september. This pregnancy has been uneventfully, with normotensive, currently denies HAS., RUQ pain or vision changes. GBS+. Low risk female. EFW 4.5lbs 1/13 61%. Pt  progressing gin latent labor now with dilation of 5cm, foley out, pitocin turned off after AROM due to cxt every 2 mins.  Patient Active Problem List   Diagnosis Date Noted  . Encounter for induction of labor 08/10/2019  . S/P primary low transverse C-section 10/29/2015  . Eclampsia complicating pregnancy in third trimester 10/28/2015   NICHD: Category 1  Membranes:  AROM @ 1200 on 3/19 x 0hrs, no s/s of infection  Asynclitic position Brow, Hands/Knees/Cat/Cow with peanut ball performed.   Induction:    Cytotec xNot recomnded due to TOLAC  Foley Bulb: inserted  0900 out at 1200 on 3/19  Pitocin - 3, stopped to increased uterine cxt.   Pain management:               Epidural placement:  at 1130 on 3/19  GBS Positive  Abx: penicillin 5 @ 0940, next dose to due at 0100  Plan: Continue labor plan Continuous/intermittent monitoring GBS +: 2nd dose to be given at 1300 Rest Frequent position changes to facilitate fetal rotation and descent with peanut ball. Will reassess with cervical exam at 1600 or earlier if necessary Plan to restart pitocin per protocol if cxt slow Anticipate labor progression and vaginal delivery.   4/19, NP-C, CNM, MSN 08/10/2019. 1:09 PM

## 2019-08-10 NOTE — Progress Notes (Signed)
Deborah Santiago is a 19 y.o. G2P0101 at [redacted]w[redacted]d by LMP admitted for induction of labor due to Post dates at 41 weeks.  Patient is a tolac.  Subjective: Patient feeling contractions despite epidural  Objective: BP 96/60   Pulse (!) 114   Temp 98 F (36.7 C) (Oral)   Resp 16   Ht 5\' 3"  (1.6 m)   Wt 82.1 kg   LMP 10/11/2018 (Exact Date)   SpO2 100%   BMI 32.06 kg/m  I/O last 3 completed shifts: In: -  Out: 750 [Urine:750] No intake/output data recorded.  FHT:  FHR: 145 bpm, variability: moderate,  accelerations:  Present,  decelerations:  Absent UC:   regular, every 2-3 minutes SVE:   Dilation: 7 Effacement (%): 60 Station: -1 Exam by:: [facility] CNM Attempt made to rotate baby from mentum posterior.  Able to rotate the baby to mentum right transverse.   Labs: Lab Results  Component Value Date   WBC 7.3 08/10/2019   HGB 12.3 08/10/2019   HCT 38.4 08/10/2019   MCV 77.6 (L) 08/10/2019   PLT 256 08/10/2019    Assessment / Plan: 19 year old SIUP at 41 weeks fetus with malpresentation mentum transverse Discussed recommendation to do a repeat cesarean section. We reviewed risks of the procedure to include hemorrhage, infection, injury to organs,  Injury to baby.   Discussed with patient that the baby's face may show swelling and ecchymosis from the  Attempted rotation.  She voiced understanding and all inquiries answered.  Consent signed and witnessed into chart  Apryl Brymer 08/10/2019, 8:00 PM

## 2019-08-10 NOTE — Progress Notes (Addendum)
Labor Progress Note  Deborah Santiago is a 19 y.o. female, G2P0101, IUP at 22 weeks, presenting for TOLAC IOL for postdates, with h/o Eclampsia in 2017 with stat PCS @33  2/7weeks, last PCR was 0.054 in september. This pregnancy has been uneventfully, with normotensive, currently denies HAS., RUQ pain or vision changes. GBS+. Low risk female. EFW 4.5lbs 1/13 61%.  Subjective: Pt resting bed, still comfortable after epidural, pt stable, now, I suspect MP fetal presentation and reported to DR Alesia Richards, DR Alesia Richards reported will be at bedside in 1 hour to assess and reported to continue pitocin and induction as is for now, Cat1 strip, pt was updated on my findings and questions were answered, I recommended a repeat CS if the fetus is in fact in thew mentum posterior positioning.  Pt verbalized understanding. Pt endorses feeling bummed about that as she wanted a vaginal delivery.  Patient Active Problem List   Diagnosis Date Noted  . Encounter for induction of labor 08/10/2019  . S/P primary low transverse C-section 10/29/2015  . Eclampsia complicating pregnancy in third trimester 10/28/2015   Objective: BP 111/66   Pulse (!) 108   Temp 98 F (36.7 C) (Oral)   Resp 18   Ht 5\' 3"  (1.6 m)   Wt 82.1 kg   LMP 10/11/2018 (Exact Date)   SpO2 100%   BMI 32.06 kg/m  No intake/output data recorded. No intake/output data recorded. NST: FHR baseline 130 bpm, Variability: moderate, Accelerations:present, Decelerations:  Absent= Cat 1/Reactive CTX:  none Uterus gravid, soft non tender, moderate to palpate with contractions.  SVE:  Dilation: 5 Effacement (%): 60 Station: -1 Exam by:: [facility], cnm Pitocin at 20mUn/min Mentum posterior face presentation suspected. Pt left on right side in trendelenburg and right leg in high fowlers with peanut ball, attempt for fetus to reposition.   Assessment:  Deborah Santiago is a 19 y.o. female, G2P0101, IUP at 3 weeks, presenting for TOLAC IOL for  postdates, with h/o Eclampsia in 2017 with stat PCS @33  2/7weeks, last PCR was 0.054 in september. This pregnancy has been uneventfully, with normotensive, currently denies HAS., RUQ pain or vision changes. GBS+. Low risk female. EFW 4.5lbs 1/13 61%. Pt stalled in latent labor at 5cm no change in last 4 hours, suspected mentum posterior.  Patient Active Problem List   Diagnosis Date Noted  . Encounter for induction of labor 08/10/2019  . S/P primary low transverse C-section 10/29/2015  . Eclampsia complicating pregnancy in third trimester 10/28/2015   NICHD: Category 1  Membranes:  AROM @ 1200 on 3/19 x 4hrs, no s/s of infection  Asynclitic position suspected mentum posterior now, dr Alesia Richards consulted and bedside assessment  requested.  Induction:    Cytotec xNot recomnded due to TOLAC  Foley Bulb: inserted  0900 out at 1200 on 3/19  Pitocin - 5  Pain management:               Epidural placement:  at 1130 on 3/19  GBS Positive  Abx: penicillin 5 @ 0940 & 1340, adequately treated.   Plan: Continue labor plan Continuous monitoring GBS +: continued penicillin Q4H Rest Frequent position changes to facilitate fetal rotation and descent with peanut ball. Will reassess with cervical exam at 1700 or earlier if necessary, with DR [facility]. Per Dr Alesia Richards continue pitocin per protocol Anticipate labor progression and vaginal delivery.   Noralyn Pick, NP-C, CNM, MSN 08/10/2019. 4:06 PM   Addendum: 4:19 PM Cat 2 with lates noted on strip,  position change, DR Sallye Ober was notified via text.

## 2019-08-10 NOTE — Progress Notes (Addendum)
Labor Progress Note  Deborah Santiago is a 19 y.o. female, G2P0101, IUP at 67 weeks, presenting for TOLAC IOL for postdates, with h/o Eclampsia in 2017 with stat PCS @33  2/7weeks, last PCR was 0.054 in september. This pregnancy has been uneventfully, with normotensive, currently denies HAS., RUQ pain or vision changes. GBS+. Low risk female. EFW 4.5lbs 1/13 61%.  Subjective: Pt resting bed, tempted 15 mins hands and knees with hip shimmies, then left side with high peanut ball and trendelenburg, pt tolerated well, IUPC had been placed per DR Pinns orders at 1700, MVU have been adequate for the last 2 hours that we have measured, mentum transverse/posterior position still noted, no change in position, with adequate MVU, talked with pt again about R/B/A of RCS and recommendation for receiving on now, pt was upset and cried but verbalized understanding of needing one. DR 2/13 has been notified and en-rout to perform repeat CS for mentum posterior positioning as well as failure to descent and progress.  Patient Active Problem List   Diagnosis Date Noted  . Encounter for induction of labor 08/10/2019  . S/P primary low transverse C-section 10/29/2015  . Eclampsia complicating pregnancy in third trimester 10/28/2015   Objective: BP 96/60   Pulse (!) 114   Temp 98 F (36.7 C) (Oral)   Resp 16   Ht 5\' 3"  (1.6 m)   Wt 82.1 kg   LMP 10/11/2018 (Exact Date)   SpO2 100%   BMI 32.06 kg/m  I/O last 3 completed shifts: In: -  Out: 750 [Urine:750] No intake/output data recorded. NST: FHR baseline 130 bpm, Variability: moderate, Accelerations:present, Decelerations:  Absent= Cat 1/Reactive CTX:  none Uterus gravid, soft non tender, moderate to palpate with contractions.  SVE:  Dilation: (no change) Effacement (%): 60 Station: -1 Exam by:: [facility], cnm Pitocin at 7 mUn/min Mentum posterior face presentation present now with fetal facial swelling and maternal cervical swelling noted.    Assessment:  Deborah Santiago is a 19 y.o. female, G2P0101, IUP at 68 weeks, presenting for TOLAC IOL for postdates, with h/o Eclampsia in 2017 with stat PCS @33  2/7weeks, last PCR was 0.054 in september. This pregnancy has been uneventfully, with normotensive, currently denies HAS., RUQ pain or vision changes. GBS+. Low risk female. EFW 4.5lbs 1/13 61%. Pt stalled in latent labor at 5cm no change in last 7 hours, mentum posterior with facial swelling noted, MVU noted to be adequate for last 2 hours after IUPC was placed with ease per DR Pinns orders. DR en route for RCS.  Patient Active Problem List   Diagnosis Date Noted  . Encounter for induction of labor 08/10/2019  . S/P primary low transverse C-section 10/29/2015  . Eclampsia complicating pregnancy in third trimester 10/28/2015   NICHD: Category 1  Membranes:  AROM @ 1200 on 3/19 x 7hrs, no s/s of infection  Asynclitic position mentum posterior  Induction:    Cytotec xNot recomnded due to TOLAC  Foley Bulb: inserted  0900 out at 1200 on 3/19  Pitocin - 7 turned off not due to RCS scheduled now   Pain management:               Epidural placement:  at 1130 on 3/19  GBS Positive  Abx: penicillin 5 @ 0940 & 1340, 1740 adequately treated.   Plan: Continue labor plan Continuous monitoring GBS +: continued penicillin Q4H DR 4/19 has been notified and en-rout to perform repeat CS for mentum posterior positioning as  well as failure to descent and progress.  Anticipate repeat CS now. DR Alwyn Pea has been notified and en-rout to perform repeat CS for mentum posterior positioning as well as failure to descent and progress.    Noralyn Pick, NP-C, CNM, MSN 08/10/2019. 7:19 PM

## 2019-08-10 NOTE — H&P (Signed)
Deborah Santiago is a 19 y.o. female, G2P0101, IUP at 75 weeks, presenting for TOLAC IOL for postdates, with h/o Eclampsia in 2017 with stat PCS @33  2/7weeks, last PCR was 0.054 in september. This pregnancy has been uneventfully, with normotensive, currently denies HAS., RUQ pain or vision changes. GBS+. Low risk female. EFW 4.5lbs 1/13 61%. Endorses feeling cxt for about 1 week now. Pt endorse + Fm. Denies vaginal leakage. Denies vaginal bleeding.   Patient Active Problem List   Diagnosis Date Noted  . Encounter for induction of labor 08/10/2019  . S/P primary low transverse C-section 10/29/2015  . Eclampsia complicating pregnancy in third trimester 10/28/2015     Medications Prior to Admission  Medication Sig Dispense Refill Last Dose  . acetaminophen (TYLENOL) 500 MG tablet Take 1,000 mg by mouth every 6 (six) hours as needed for moderate pain.     12/28/2015 albuterol (PROVENTIL HFA;VENTOLIN HFA) 108 (90 Base) MCG/ACT inhaler Inhale 1-2 puffs into the lungs every 6 (six) hours as needed for wheezing or shortness of breath.     . cetirizine (ZYRTEC) 10 MG tablet Take 10 mg by mouth daily.     Marland Kitchen ibuprofen (ADVIL) 200 MG tablet Take 3 tablets (600 mg total) by mouth every 6 (six) hours as needed for mild pain. 30 tablet 0   . KURVELO 0.15-30 MG-MCG tablet Take 1 tablet by mouth daily.     09-30-2003 oxyCODONE (OXY IR/ROXICODONE) 5 MG immediate release tablet Take 1 tablet (5 mg total) by mouth every 4 (four) hours as needed (pain scale 4-7). 30 tablet 0   . Prenatal Vit-Fe Fumarate-FA (PRENATAL MULTIVITAMIN) TABS tablet Take 1 tablet by mouth daily at 12 noon.       Past Medical History:  Diagnosis Date  . History of HELLP syndrome, currently pregnant   . History of pre-eclampsia      No current facility-administered medications on file prior to encounter.   Current Outpatient Medications on File Prior to Encounter  Medication Sig Dispense Refill  . acetaminophen (TYLENOL) 500 MG tablet Take 1,000 mg  by mouth every 6 (six) hours as needed for moderate pain.    Marland Kitchen albuterol (PROVENTIL HFA;VENTOLIN HFA) 108 (90 Base) MCG/ACT inhaler Inhale 1-2 puffs into the lungs every 6 (six) hours as needed for wheezing or shortness of breath.    . cetirizine (ZYRTEC) 10 MG tablet Take 10 mg by mouth daily.    Marland Kitchen ibuprofen (ADVIL) 200 MG tablet Take 3 tablets (600 mg total) by mouth every 6 (six) hours as needed for mild pain. 30 tablet 0  . KURVELO 0.15-30 MG-MCG tablet Take 1 tablet by mouth daily.    09-30-2003 oxyCODONE (OXY IR/ROXICODONE) 5 MG immediate release tablet Take 1 tablet (5 mg total) by mouth every 4 (four) hours as needed (pain scale 4-7). 30 tablet 0  . Prenatal Vit-Fe Fumarate-FA (PRENATAL MULTIVITAMIN) TABS tablet Take 1 tablet by mouth daily at 12 noon.       No Known Allergies  History of present pregnancy: Pt Info/Preference:  Screening/Consents:  Labs:   EDD: Estimated Date of Delivery: 08/03/19  Establised: Patient's last menstrual period was 10/11/2018 (exact date).  Anatomy Scan: Date: 03/19/2019 Placenta Location: posterior Genetic Screen: Panoroma:low risk female AFP: normal First Tri: Quad:  Office: [facility]            First PNV: 12.4 wg Blood Type B/Positive/-- (09/01 0000)  Language: english Last PNV: 40.6 wg Rhogam    Flu Vaccine:  utd  Antibody Negative (09/01 0000)  TDaP vaccine utd   GTT: Early: 5.0hga1c Third Trimester: 84  Feeding Plan: Breast/bottle BTL: no Rubella: Immune (09/01 0000)  Contraception: ??? VBAC: Yes, consent singed on LD today RPR: Nonreactive (09/01 0000)   Circumcision: Out pt   HBsAg: Negative (09/01 0000)  Pediatrician:  Dr Excell Seltzer   HIV: Non-reactive (09/01 0000)   Prenatal Classes: no Additional [place]: 1/13 growth see below GBS: Positive/-- (02/10 0000)(For PCN allergy, check sensitivities)       Chlamydia: neg    MFM Referral/Consult:  GC: neg  Support Person: parnter   PAP: 2017-normal  Pain Management: epidural Neonatologist Referral:  Hgb  Electrophoresis:  AA  Birth Plan: VBAC   Hgb NOB: 12.7    28W: 11.9  1/13 growth  OB History    Gravida  2   Para  1   Term      Preterm  1   AB      Living  1     SAB      TAB      Ectopic      Multiple  0   Live Births  1          Past Medical History:  Diagnosis Date  . History of HELLP syndrome, currently pregnant   . History of pre-eclampsia    Past Surgical History:  Procedure Laterality Date  . CESAREAN SECTION N/A 10/28/2015   Procedure: CESAREAN SECTION;  Surgeon: Huel Cote, MD;  Location: [facility] BIRTHING SU[facility];  Service: Obstetrics;  Laterality: N/A;  . WISDOM TOOTH EXTRACTION     Family History: family history includes Hypertension in her mother. Social History:  reports that she has never smoked. She has never used smokeless tobacco. She reports current alcohol use. She reports that she does not use drugs.   Prenatal Transfer Tool  Maternal Diabetes: No Genetic Screening: Normal Maternal Ultrasounds/Referrals: Normal Fetal Ultrasounds or other Referrals:  None Maternal Substance Abuse:  No Significant Maternal Medications:  None Significant Maternal Lab Results: Group B Strep positive  ROS:  Review of Systems  Constitutional: Negative.   HENT: Negative.   Eyes: Negative.   Respiratory: Negative.   Cardiovascular: Negative.   Gastrointestinal: Positive for abdominal pain.  Genitourinary: Negative.   Musculoskeletal: Negative.   Skin: Negative.   Neurological: Negative.   Endo/Heme/Allergies: Negative.   Psychiatric/Behavioral: Negative.      Physical Exam: BP 108/62 (BP Location: Right Arm)   Pulse 98   Temp 98.1 F (36.7 C) (Oral)   Resp 16   Ht 5\' 3"  (1.6 m)   Wt 82.1 kg   LMP 10/11/2018 (Exact Date)   BMI 32.06 kg/m   Physical Exam  Constitutional: She is oriented to person, place, and time and well-developed, well-nourished, and in no distress.  HENT:  Head: Normocephalic and atraumatic.  Eyes: Pupils are equal,  round, and reactive to light. Conjunctivae are normal.  Cardiovascular: Normal rate and regular rhythm.  Pulmonary/Chest: Effort normal and breath sounds normal.  Abdominal: Soft. Bowel sounds are normal.  Genitourinary:    Genitourinary Comments: Uterus gravida equal to dates, pelvis adequate for vaginal delivery, abdomen soft, uterus soft non-tender.    Musculoskeletal:        General: Normal range of motion.     Cervical back: Normal range of motion and neck supple.  Neurological: She is alert and oriented to person, place, and time. Gait normal.  Skin: Skin is warm and dry.  Psychiatric: Affect normal.  Nursing note and vitals reviewed.    NST: FHR baseline 130 bpm, Variability: moderate, Accelerations:present, Decelerations:  Absent= Cat 1/Reactive UC:   regular, every 3-4 minutes, lasting 80 seconds, mild to palpate  SVE:   Dilation: 1.5 Effacement (%): 50 Station: Ballotable Exam by:: erin davis rn, vertex verified by fetal sutures.  Leopold's: Position vertex, EFW 6.5lbs via leopold's.   Foley bulb placed with ease, instilled into balloon. Fetus and pt tolerated well.   Labs: No results found for this or any previous visit (from the past 24 hour(s)).  Imaging:  No results found.  MAU Course: Orders Placed This Encounter  Procedures  . CBC  . RPR  . Diet clear liquid Room service appropriate? Yes; Fluid consistency: Thin  . Evaluate fetal heart rate to establish reassuring pattern prior to initiating Cytotec or Pitocin  . Perform a cervical exam prior to initiating Cytotec or Pitocin  . Discontinue Pitocin if tachysystole with non-reassuring FHR is present  . Nofify MD/CNM if tachysystole with non-reassuring FHR is present  . Initiate intrauterine resuscitation if tachysystole with non-reasuring FHR is present  . If tachysystole WITH reassuring FHR present notify MD / CNM  . May administer Terbutaline 0.25 mg SQ x 1 dose if tachysystole with non-reassuring FHR  is presesnt  . Labor Induction  . Vitals signs per unit policy  . Notify Physician  . Fetal monitoring per unit policy  . Activity as tolerated  . Cervical Exam  . Measure blood pressure post delivery every 15 min x 1 hour then every 30 min x 1 hour  . Fundal check post delivery every 15 min x 1 hour then every 30 min x 1 hour  . If Rapid HIV test positive or known HIV positive: initiate AZT orders  . May in and out cath x 2 for inability to void  . Insert foley catheter  . Discontinue foley prior to vaginal delivery  . Initiate Carrier Fluid Protocol  . Initiate Oral Care Protocol  . Informed Consent Details: Physician/Practitioner Attestation; Transcribe to consent form and obtain patient signature  . Patient may have epidural placement upon request  . Order Rapid HIV per protocol if no results on chart  . Evaluate fetal heart rate to establish reassuring pattern prior to initiating Cytotec or Pitocin  . Perform a cervical exam prior to initiating Cytotec or Pitocin  . Discontinue Pitocin if tachysystole with non-reassuring FHR is present  . Nofify MD/CNM if tachysystole with non-reassuring FHR is present  . Initiate intrauterine resuscitation if tachysystole with non-reasuring FHR is present  . If tachysystole WITH reassuring FHR present notify MD / CNM  . May administer Terbutaline 0.25 mg SQ x 1 dose if tachysystole with non-reassuring FHR is presesnt  . Labor Induction  . Full code  . Type and screen [facility]  . Insert and maintain IV Line  . Admit to Inpatient (patient's expected length of stay will be greater than 2 midnights or inpatient only procedure)   Meds ordered this encounter  Medications  . terbutaline (BRETHINE) injection 0.25 mg  . DISCONTD: oxytocin (PITOCIN) IV infusion 40 units in NS 1000 mL - Premix    Order Specific Question:   Begin infusion at:    Answer:   2 milli-units/min (3 mL/hr)    Order Specific Question:   Increase infusion  by:    Answer:   2 milli-units/min (3 mL/hr)  . oxytocin (PITOCIN) IV infusion 40 units in NS 1000  mL - Premix    Order Specific Question:   Begin infusion at:    Answer:   1 milli-unit/min (1.5 mL/hr)    Order Specific Question:   Increase infusion by:    Answer:   1 milli-unit/min (1.5 mL/hr)  . lactated ringers infusion  . oxytocin (PITOCIN) IV BOLUS FROM BAG  . oxytocin (PITOCIN) IV infusion 40 units in NS 1000 mL - Premix  . lactated ringers infusion 500-1,000 mL  . acetaminophen (TYLENOL) tablet 650 mg  . oxyCODONE-acetaminophen (PERCOCET/ROXICET) 5-325 MG per tablet 1 tablet  . oxyCODONE-acetaminophen (PERCOCET/ROXICET) 5-325 MG per tablet 2 tablet  . ondansetron (ZOFRAN) injection 4 mg  . sodium citrate-citric acid (ORACIT) solution 30 mL  . lidocaine (PF) (XYLOCAINE) 1 % injection 30 mL  . FOLLOWED BY Linked Order Group   . penicillin G potassium 5 Million Units in sodium chloride 0.9 % 250 mL IVPB     Order Specific Question:   Antibiotic Indication:     Answer:   Group B Strep Prophylaxis   . penicillin G potassium 3 Million Units in dextrose 89mL IVPB     Order Specific Question:   Antibiotic Indication:     Answer:   Group B Strep Prophylaxis  . terbutaline (BRETHINE) injection 0.25 mg  . oxytocin (PITOCIN) IV infusion 40 units in NS 1000 mL - Premix    Order Specific Question:   Begin infusion at:    Answer:   1 milli-unit/min (1.5 mL/hr)    Order Specific Question:   Increase infusion by:    Answer:   1 milli-unit/min (1.5 mL/hr)    Assessment/Plan: NALIAH EDDINGTON is a 19 y.o. female, G2P0101, IUP at 68 weeks, presenting for TOLAC IOL for postdates, with h/o Eclampsia in 2017 with stat PCS @33  2/7weeks, last PCR was 0.054 in september. This pregnancy has been uneventfully, with normotensive, currently denies HAS., RUQ pain or vision changes. GBS+. Low risk female. EFW 4.5lbs 1/13 61%. Endorses feeling cxt for about 1 week now. Pt endorse + Fm. Denies vaginal  leakage. Denies vaginal bleeding. Cervix 1.5cm dilated foley bulb placed with ease.   FWB: Cat 1 Fetal Tracing.   Plan: Admit to [place] per consult with DR Alesia Richards Routine [facility] orders Pain med/epidural prn PCN G for GBS prophylaxis  Induction with foley bulb and low dose pitocin until cervix rip or foley bulb falls out.  Anticipate labor progression   Noralyn Pick NP-C, CNM, MSN 08/10/2019, 9:18 AM

## 2019-08-11 DIAGNOSIS — O9902 Anemia complicating childbirth: Secondary | ICD-10-CM

## 2019-08-11 HISTORY — DX: Anemia complicating childbirth: O99.02

## 2019-08-11 LAB — CBC
HCT: 31.2 % — ABNORMAL LOW (ref 36.0–46.0)
Hemoglobin: 10 g/dL — ABNORMAL LOW (ref 12.0–15.0)
MCH: 24.5 pg — ABNORMAL LOW (ref 26.0–34.0)
MCHC: 32.1 g/dL (ref 30.0–36.0)
MCV: 76.5 fL — ABNORMAL LOW (ref 80.0–100.0)
Platelets: 222 10*3/uL (ref 150–400)
RBC: 4.08 MIL/uL (ref 3.87–5.11)
RDW: 16.1 % — ABNORMAL HIGH (ref 11.5–15.5)
WBC: 16 10*3/uL — ABNORMAL HIGH (ref 4.0–10.5)
nRBC: 0 % (ref 0.0–0.2)

## 2019-08-11 MED ORDER — ONDANSETRON 4 MG PO TBDP: 4.0000 mg | ORAL_TABLET | Freq: Three times a day (TID) | ORAL | Status: DC | PRN

## 2019-08-11 MED ORDER — SCOPOLAMINE 1 MG/3DAYS TD PT72
1.0000 | MEDICATED_PATCH | TRANSDERMAL | Status: DC
  Administered 2019-08-11: 1.5 mg via TRANSDERMAL
  Filled 2019-08-11: qty 1

## 2019-08-11 MED ORDER — ONDANSETRON HCL 4 MG/2ML IJ SOLN
4.0000 mg | Freq: Four times a day (QID) | INTRAMUSCULAR | Status: DC
  Administered 2019-08-11: 4 mg via INTRAVENOUS
  Filled 2019-08-11 (×3): qty 2

## 2019-08-11 NOTE — Progress Notes (Signed)
RN straight cathed pt with assistance of NT3 d/t pt complaining of pelvic discomfort and bladder scan estimating .  Pt tolerated process well, clear yellow urine emptied from bladder.  Once catheter was removed pt reports great relief.  RN educated pt on drinking plenty of fluids and attempting to void periodically, pt verbalizes understanding.

## 2019-08-11 NOTE — Progress Notes (Addendum)
Patient ID: EVELEEN MCNEAR, female   DOB: 12/09/2000, 19 y.o.   MRN: [id number] Subjective: POD# 1 Live born female  Birth Weight: 7 lb 9.8 oz (3453 g) APGAR: 8, 9  Newborn Delivery   Birth date/time: 08/10/2019 21:13:00 Delivery type: C-Section, Vacuum Assisted Trial of labor: Yes C-section categorization: Repeat     Baby name: Marjie Skiff. Delivering provider: Essie Hart   circumcision planned outpatient Feeding: breast and bottle  Pain control at delivery: Epidural   Reports feeling tired and sore but well.  Patient reports tolerating PO.   Breast symptoms:+ colostrum Pain controlled with  Toradol and spinal anesthesia Denies HA/SOB/C/P/N/V/dizziness. Flatus present. She reports vaginal bleeding as normal, without clots.  She has not been out of bed yet, foley cath in place.      Objective:   VS:    Vitals:   08/11/19 0030 08/11/19 0130 08/11/19 0230 08/11/19 0642  BP: 116/70 122/76 117/66 113/61  Pulse: (!) 104 (!) 105 (!) 115 94  Resp: 18 16 17 17   Temp: 98.8 F (37.1 C) 98.8 F (37.1 C) 99.2 F (37.3 C) 98.9 F (37.2 C)  TempSrc: Oral Oral Oral Oral  SpO2: 100% 100% 98% 98%  Weight:      Height:          Intake/Output Summary (Last 24 hours) at 08/11/2019 0827 Last data filed at 08/11/2019 08/13/2019 Gross per 24 hour  Intake 1300 ml  Output 2735 ml  Net -1435 ml        Recent Labs    08/10/19 0859 08/11/19 0532  WBC 7.3 16.0*  HGB 12.3 10.0*  HCT 38.4 31.2*  PLT 256 222     Blood type: --/--/B POS, B POS Performed at [facility], [address] [place], [place]  [phone] (81/82)  Rubella: Immune (09/01 0000)  Vaccines: TDaP UTD         Flu    UTD   Physical Exam:  General: alert, cooperative, and no distress CV: Regular rate and rhythm Resp: clear Abdomen: soft, nontender, normal bowel sounds Incision: clean, dry, intact, and pressure dressing in place Uterine Fundus: firm, below umbilicus, nontender Lochia: minimal Ext:  extremities normal, atraumatic, no cyanosis or edema      Assessment/Plan: 19 y.o.   POD# 1. 26                  Principal Problem:   Postpartum care following cesarean delivery 3/19 Active Problems:   Encounter for induction of labor   Maternal anemia, with delivery  - started oral Fe and Mag Ox   Cesarean delivery - failed TOLAC, AOD 6 cm, face presentation  Doing well, stable.    - DC foley this am and start ambulating - may shower this PM and take pressure dressing off           Advance diet as tolerated Encourage rest when baby rests Breastfeeding support Routine post-op care  4/19, CNM, MSN 08/11/2019, 8:27 AM   Attestation of Attending Supervision of Advanced Practitioner (CNM/NP): Evaluation and management procedures were performed by the Advanced Practitioner under my supervision and collaboration.  I have reviewed the Advanced Practitioner's note and chart, and I agree with the management and plan. I saw and examined patient at bedside and agree with above findings, assessment and plan as outlined above by CNM 08/13/2019.  Dr. Neta Mends.  08/11/2019. 1530.

## 2019-08-11 NOTE — Progress Notes (Signed)
Pt believes she voided when the catheter was removed.  Has attempted to void this afternoon and was unable to.  She was encouraged to push fluids, and has increased her intake.  Instructed patient that staff will do a bladder scan and potentially an in and out cath procedure.  Pt is intermittently trying to void, stating she will try again, and also is feeding/caring for infant in between attempts.  Reported to oncoming RN.  Delice Bison Navneet Schmuck Charity fundraiser

## 2019-08-11 NOTE — Lactation Note (Signed)
This note was copied from a baby's chart. Lactation Consultation Note  Patient Name: Deborah Santiago Date: 08/11/2019  [name] is 15 hours old  Baby awake , LC offered to work on Administrator . 1st reviewed hand expressing and was able to obtain total of 5 ml and baby tolerated spoon feeding well.  Attempted to latch several  Times and baby was pushing off with his tongue and unable to sustain a deep latch few swallows.  Per mom  had + breast changes and excellent colostrum flow. LC encouraged hand expressing in between feedings and LC showed mom how to spoon feed.  LC also instructed mom on the use shells between feedings except when sleeping.  Mom has erect nipples / shells are to compress the areolas better.  Per mom  has DEBP at home and [facility] provided the [facility] pamphlet with phone  numbers.    Maternal Data    Feeding Feeding Type: Breast Fed  LATCH Score Latch: Repeated attempts needed to sustain latch, nipple held in mouth throughout feeding, stimulation needed to elicit sucking reflex.  Audible Swallowing: A few with stimulation  Type of Nipple: Everted at rest and after stimulation  Comfort (Breast/Nipple): Soft / non-tender  Hold (Positioning): Assistance needed to correctly position infant at breast and maintain latch.  LATCH Score: 7  Interventions Interventions: Breast feeding basics reviewed  Lactation Tools Discussed/Used     Consult Status Consult Status: Follow-up Date: 08/12/19 Follow-up type: In-patient    [name] 08/11/2019, 12:30 PM

## 2019-08-12 ENCOUNTER — Other Ambulatory Visit: Payer: Self-pay

## 2019-08-12 NOTE — Anesthesia Postprocedure Evaluation (Signed)
Anesthesia Post Note  Patient: Deborah Santiago  Procedure(s) Performed: CESAREAN SECTION (N/A Abdomen)     Patient location during evaluation: Mother Baby Anesthesia Type: Epidural Level of consciousness: awake and alert Pain management: pain level controlled Vital Signs Assessment: post-procedure vital signs reviewed and stable Respiratory status: spontaneous breathing, nonlabored ventilation and respiratory function stable Cardiovascular status: stable Postop Assessment: no headache, no backache and epidural receding Anesthetic complications: no    Last Vitals:  Vitals:   08/11/19 2217 08/12/19 0537  BP: 108/67 103/66  Pulse: 89 93  Resp: 18 18  Temp: 36.7 C 36.9 C  SpO2:      Last Pain:  Vitals:   08/12/19 0720  TempSrc:   PainSc: Asleep   Pain Goal: Patients Stated Pain Goal: 2 (08/11/19 1030)                 [name]

## 2019-08-12 NOTE — Progress Notes (Signed)
Deborah Santiago [id number] Postpartum Postoperative Day # 2  Melea M Laton, G2P1102, [redacted]w[redacted]d, S/P REPEAT LT Cesarean Section due to MENTUM POSTERIOR FETAL PRESENTATION WITH NO FETAL DESCENT. .   Subjective: Patient up ad lib, denies syncope or dizziness. Reports consuming regular diet without issues and denies N/V. Patient reports 0 bowel movement + passing flatus.  Denies issues with urination and reports bleeding is "lighter."  Patient is breast and bottlefeeding and reports going well.  Desires undecided for postpartum contraception.  Pain is being appropriately managed with use of po meds. Deborah Santiago is a 19 y.o. female, now G2P1102, IUP at 75 weeks, presenting for TOLAC IOL for postdates ednded with repeat LTCS due to mentum posterior fetal presentation, with h/o Eclampsia in 2017 with stat PCS @33  2/7weeks, last PCR was 0.054 in september. This pregnancy has been uneventfully, with normotensive, currently denies HAS., RUQ pain or vision changes. GBS+. Low risk female desires out pt circ.    Objective: Patient Vitals for the past 24 hrs:  BP Temp Temp src Pulse Resp SpO2  08/12/19 0537 103/66 98.5 F (36.9 C) Oral 93 18 --  08/11/19 2217 108/67 98 F (36.7 C) Oral 89 18 --  08/11/19 1405 109/60 97.8 F (36.6 C) Oral 99 18 99 %  08/11/19 1030 -- 98.2 F (36.8 C) -- -- 18 100 %     Physical Exam:  General: alert, cooperative, appears stated age and no distress Mood/Affect: Happy Lungs: clear to auscultation, no wheezes, rales or rhonchi, symmetric air entry.  Heart: normal rate, regular rhythm, normal S1, S2, no murmurs, rubs, clicks or gallops. Breast: breasts appear normal, no suspicious masses, no skin or nipple changes or axillary nodes. Abdomen:  + bowel sounds, soft, non-tender Incision: healing well, no significant drainage, no dehiscence, no significant erythema, Honeycomb dressing  Uterine Fundus: firm, involution -1 Lochia: appropriate Skin: Warm, Dry. DVT  Evaluation: No evidence of DVT seen on physical exam. Negative Homan's sign. No cords or calf tenderness. No significant calf/ankle edema.  Labs: Recent Labs    08/10/19 0859 08/11/19 0532  HGB 12.3 10.0*  HCT 38.4 31.2*  WBC 7.3 16.0*    CBG (last 3)  No results for input(s): GLUCAP in the last 72 hours.   I/O: I/O last 3 completed shifts: In: 2365 [P.O.:840; I.V.:1525] Out: 4260 [Urine:3800; Blood:460]   Assessment Postpartum Postoperative Day # 2. 2366, Linton Rump, [redacted]w[redacted]d, S/P repeat LT Cesarean Section due to mentum posterior fetal presentation without fetyal descent with IOIL for TOLAC and postdates.  Pt stable. -1 Involution. Breast/bottle Feeding. Hemodynamically Stable with gb drop from 12.3-10 and QBL was 05-30-1981.  Plan: Continue other mgmt as ordered VTE Prophylactics: SCD, ambulated as tolerates.  Pain control: Motrin/Tylenol/Narcotics PRN Education given regarding options for contraception, including barrier methods, injectable contraception, IUD placement, oral contraceptives.  Plan for discharge tomorrow, Breastfeeding and Lactation consult  Dr. to be updated on patient status.  [facility] NP-C, CNM 08/12/2019, 9:15 AM

## 2019-08-12 NOTE — Progress Notes (Signed)
CSW received consult due to score 11 on Edinburgh Depression Screen.    CSW met with MOB at bedside to discuss EDS score. MOB reported she was feeling tired and a little concerned about being a mother of two children. MOB reports having 21 y/o child as well. MOB reports no formal mental health dx but believes she may have struggled with anxiety from time to time in the past. . MOB reported having some sadness and anxiety after having first child when 8. MOB reported never being dx or treated for PPD/A.  MOB reported her mom's support was very helpful during that time. MOB denied any SI, HI, or domestic violence. MOB identified current support system as FOB, mom, step-dad, sister, brother, and grandparents. MOB declined to talk with OB about medication, however, stated she would if she felt sx were no longer manageable. MOB was open to counseling resources provided by CSW.  MOB believes things learned with first child will reduce anxiety this time around. CSW offer support and reassurance.   CSW provided education regarding Baby Blues vs PMADs and provided MOB with resources for mental health follow up.  CSW encouraged MOB to evaluate her mental health throughout the postpartum period with the use of the New Mom Checklist developed by Postpartum [employer] as well as the [place] Postnatal Depression Scale and notify a medical professional if symptoms arise. MOB asked about appropriate questions and confirmed understanding.   CSW provided review of Sudden Infant Death Syndrome (SIDS) precautions.  MOB confirmed having all needed items for baby including car seat and bassinet for safe sleeping area.   CSW identifies no further need for intervention and no barriers to discharge at this time.  Lenae Wherley D. Lissa Morales, MSW, [facility] Clinical Social Worker [phone]

## 2019-08-13 MED ORDER — IBUPROFEN 600 MG PO TABS: 600.0000 mg | ORAL_TABLET | Freq: Four times a day (QID) | ORAL | 0 refills | Status: DC

## 2019-08-13 MED ORDER — OXYCODONE HCL 5 MG PO TABS: 5.0000 mg | ORAL_TABLET | ORAL | 0 refills | Status: DC | PRN

## 2019-08-13 NOTE — Discharge Summary (Signed)
Postpartum Discharge Summary     Patient Name: Deborah Santiago DOB: 08/19/2000 MRN: [id number]  Date of admission: 08/10/2019 Delivering Provider: Sanjuana Kava   Date of discharge: 08/13/2019  Admitting diagnosis: Encounter for induction of labor [Z34.90] Intrauterine pregnancy: [redacted]w[redacted]d    Secondary diagnosis:  Principal Problem:   Postpartum care following cesarean delivery 3/19 Active Problems:   Encounter for induction of labor   Maternal anemia, with delivery   Cesarean delivery - failed TOLAC, AOD 6 cm, face presentation      Discharge diagnosis: Repeat Cesarean                                                                                                 Post partum procedures:NA  Augmentation: Pitocin and Foley Balloon  Complications: None  Hospital course:  Induction of Labor With Cesarean Section  19y.o. yo [id number] 2100w0das admitted to the hospital 08/10/2019 for induction of labor. Patient had a labor course significant for face presntation. The patient went for cesarean section due to Malpresentation, and delivered a Viable infant,08/10/2019  Membrane Rupture Time/Date: 12:03 PM ,08/10/2019   Details of operation can be found in separate operative Note.  Patient had an uncomplicated postpartum course. She is ambulating, tolerating a regular diet, passing flatus, and urinating well.  Patient is discharged home in stable condition on 08/13/19.                                   Delivery time: 9:13 PM    Magnesium Sulfate received: No BMZ received: No Rhophylac:N/A MMR:N/A Transfusion:No  Physical exam  Vitals:   08/12/19 0537 08/12/19 1454 08/12/19 2148 08/13/19 0520  BP: 103/66 108/63 117/75 111/69  Pulse: 93 85 92 90  Resp: '18 18 18 18  ' Temp: 98.5 F (36.9 C) 98.4 F (36.9 C) 98.2 F (36.8 C) 98.4 F (36.9 C)  TempSrc: Oral Oral Oral Oral  SpO2:   100% 100%  Weight:      Height:       General: alert, cooperative and no distress Lochia:  appropriate Uterine Fundus: firm Incision: Dressing is clean, dry, and intact DVT Evaluation: No evidence of DVT seen on physical exam. Negative Homan's sign. No cords or calf tenderness. No significant calf/ankle edema. Labs: Lab Results  Component Value Date   WBC 16.0 (H) 08/11/2019   HGB 10.0 (L) 08/11/2019   HCT 31.2 (L) 08/11/2019   MCV 76.5 (L) 08/11/2019   PLT 222 08/11/2019   CMP Latest Ref Rng & Units 10/31/2015  Glucose 65 - 99 mg/dL 79  BUN 6 - 20 mg/dL 10  Creatinine 0.50 - 1.00 mg/dL 0.76  Sodium 135 - 145 mmol/L 136  Potassium 3.5 - 5.1 mmol/L 3.9  Chloride 101 - 111 mmol/L 105  CO2 22 - 32 mmol/L 26  Calcium 8.9 - 10.3 mg/dL 8.3(L)  Total Protein 6.5 - 8.1 g/dL 5.2(L)  Total Bilirubin 0.3 - 1.2 mg/dL 0.7  Alkaline Phos 47 - 119 U/L 100  AST  15 - 41 U/L 40  ALT 14 - 54 U/L 46   Edinburgh Score: Edinburgh Postnatal Depression Scale Screening Tool 08/11/2019  I have been able to laugh and see the funny side of things. 0  I have looked forward with enjoyment to things. 0  I have blamed myself unnecessarily when things went wrong. 2  I have been anxious or worried for no good reason. 2  I have felt scared or panicky for no good reason. 2  Things have been getting on top of me. 2  I have been so unhappy that I have had difficulty sleeping. 0  I have felt sad or miserable. 2  I have been so unhappy that I have been crying. 1  The thought of harming myself has occurred to me. 0  Edinburgh Postnatal Depression Scale Total 11    Discharge instruction: per After Visit Summary and "Baby and Me Booklet".  After visit meds:  Allergies as of 08/13/2019   No Known Allergies     Medication List    STOP taking these medications   acetaminophen 500 MG tablet Commonly known as: TYLENOL   albuterol 108 (90 Base) MCG/ACT inhaler Commonly known as: VENTOLIN HFA   aspirin EC 81 MG tablet   prenatal multivitamin Tabs tablet     TAKE these medications   ibuprofen  600 MG tablet Commonly known as: ADVIL Take 1 tablet (600 mg total) by mouth every 6 (six) hours.   oxyCODONE 5 MG immediate release tablet Commonly known as: Oxy IR/ROXICODONE Take 1-2 tablets (5-10 mg total) by mouth every 4 (four) hours as needed for moderate pain.       Diet: routine diet  Activity: Advance as tolerated. Pelvic rest for 6 weeks.   Outpatient follow up:6 weeks Follow up Appt:No future appointments. Follow up Visit: Follow-up Information    Ob/Gyn, [place] Follow up in 6 week(s).   Specialty: Obstetrics and Gynecology Contact information: [address] [place] [address] [phone]            Newborn Data: Live born female  Birth Weight: 7 lb 9.8 oz (3453 g) APGAR: 8, 9  Newborn Delivery   Birth date/time: 08/10/2019 21:13:00 Delivery type: C-Section, Vacuum Assisted Trial of labor: Yes C-section categorization: Repeat      Baby Feeding: Bottle Disposition:home with mother   08/13/2019 Ike Bene, CNM

## 2019-08-13 NOTE — Lactation Note (Signed)
This note was copied from a baby's chart. Lactation Consultation Note Baby 93 hrs old. Mom is supplementing w/formula as well as colostrum. Mom was resting but woke when LC came in. FOB watching baby. Asked mom how BF going, mom stated OK. Praised mom for giving colostrum. Encouraged to give before giving formula, don't give formula if not needed. Pumping encouraged.  Patient Name: Deborah Santiago Date: 08/13/2019 Reason for consult: Follow-up assessment;Term   Maternal Data    Feeding Feeding Type: Formula Nipple Type: Slow - flow  LATCH Score                   Interventions    Lactation Tools Discussed/Used     Consult Status Consult Status: Follow-up Date: 08/13/19 Follow-up type: In-patient    Charyl Dancer 08/13/2019, 3:36 AM

## 2019-08-13 NOTE — Lactation Note (Signed)
This note was copied from a baby's chart. Lactation Consultation Note  Patient Name: Deborah Santiago [id number] Date: 08/13/2019 Reason for consult: Follow-up assessment;Term  LC in to visit P2 Mom of term baby on day of discharge.  Baby 62 hrs old and at 7% weight loss.  Baby had been latching following with formula per Mom's choice.  Mom states her nipples are bleeding some when she is pumping.  Flanges are not compressing nipples.  Coconut oil given with instructions on use.   Offered to assist with latching before discharge.  Mom declined but stated she would like OP lactation appointment to assist her with latch once her nipples are healed.    Encouraged Mom to double pump >8 times per 24 hrs.  Encouraged having baby STS and using hand expression and breast massage.    Mom's milk volume increasing, she expressed 1-2 oz last pumping.  Engorgement prevention and treatment reviewed.  Mom aware of OP lactation support and encouraged to call prn. Interventions Interventions: Breast feeding basics reviewed;Skin to skin;Breast massage;Hand express;DEBP;Coconut oil  Lactation Tools Discussed/Used Tools: Pump;Coconut oil;Bottle Breast pump type: Double-Electric Breast Pump   Consult Status Consult Status: Complete Date: 08/13/19 Follow-up type: Out-patient    [name] 08/13/2019, 12:05 PM

## 2020-01-25 IMAGING — CR RIGHT THUMB 2+V
3 series · 3 of 3 positions shown · non-contrast
Comparison: None.

CLINICAL DATA: Right thumb pain after a fall last week in.

EXAM:
RIGHT THUMB 2+V

[x finger pa right]
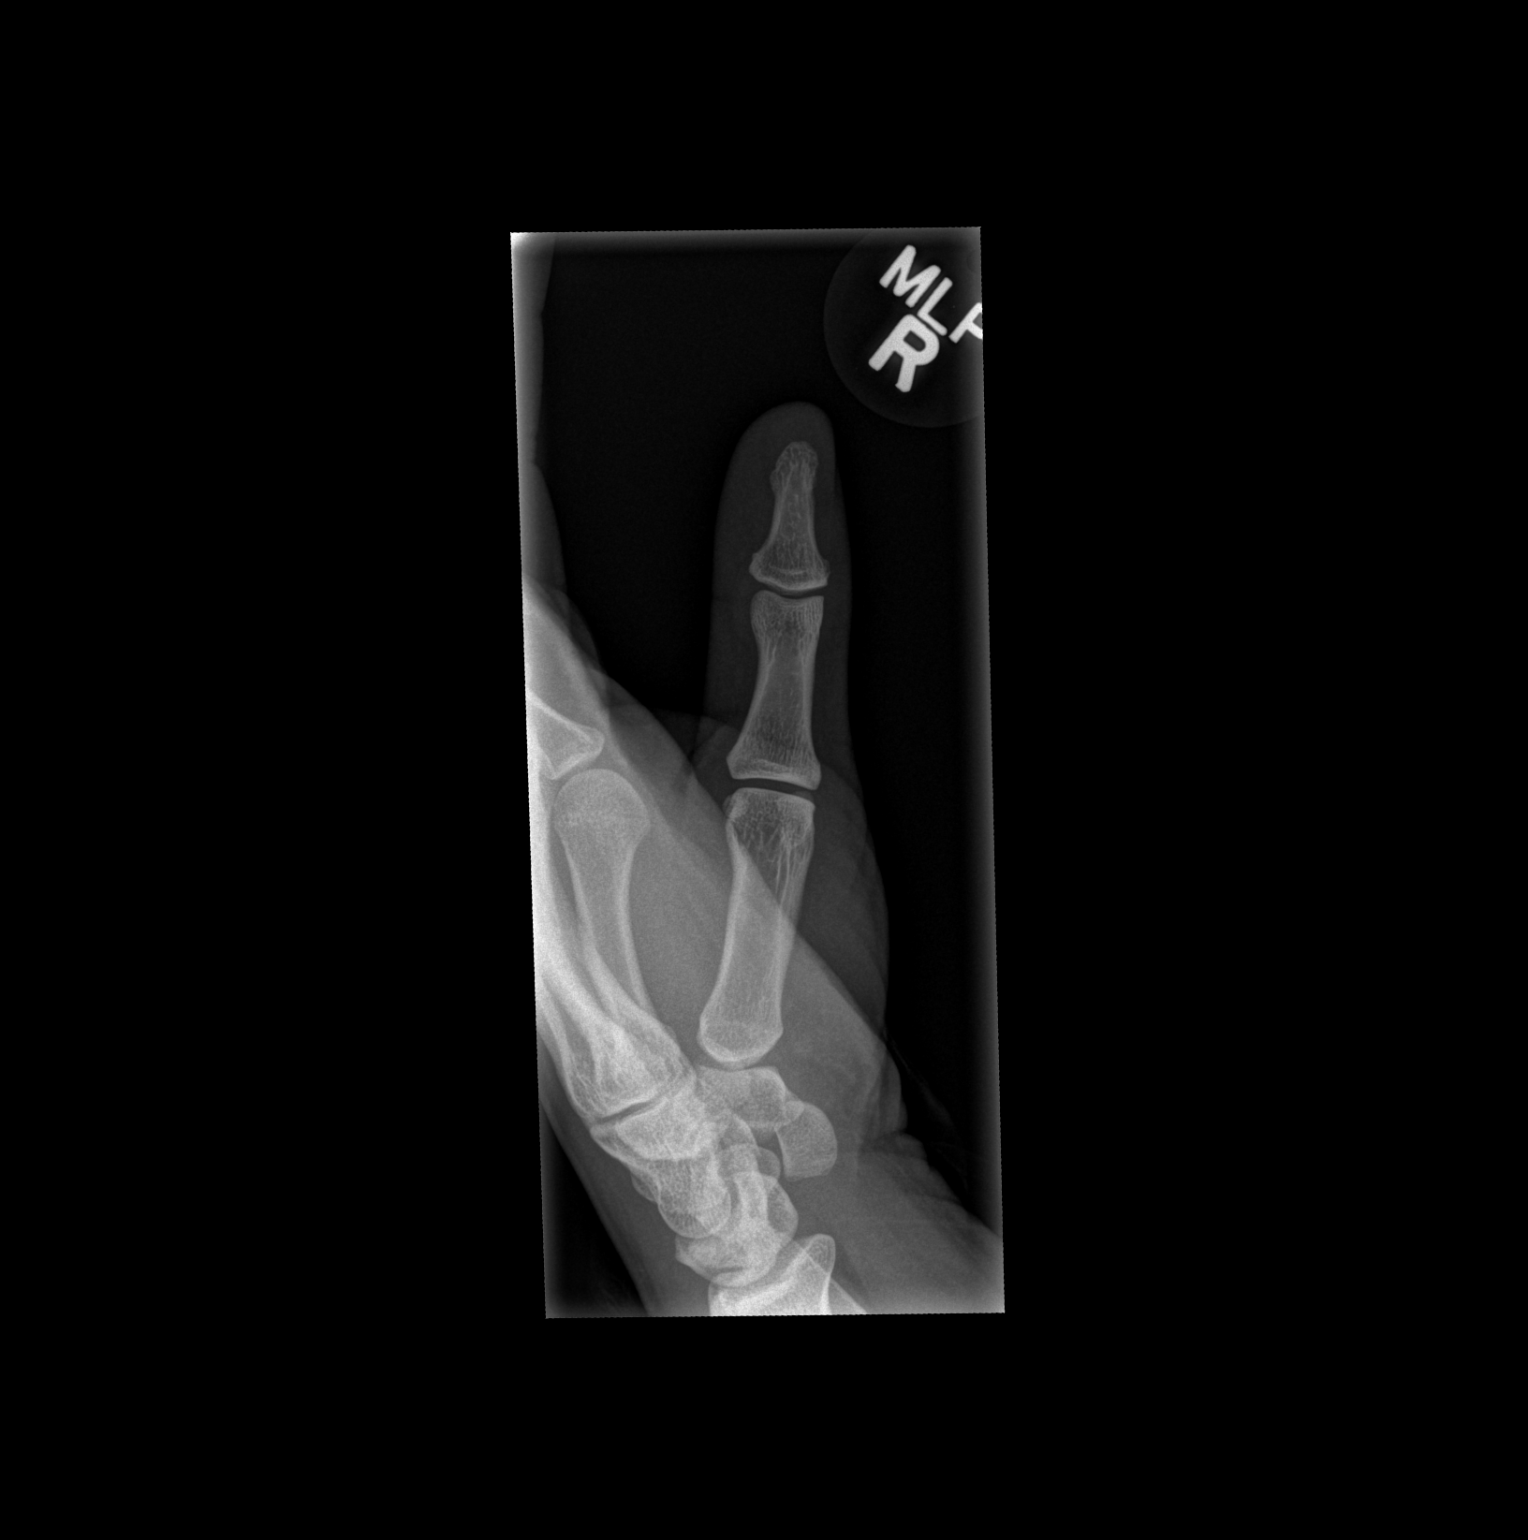

[x finger obl right]
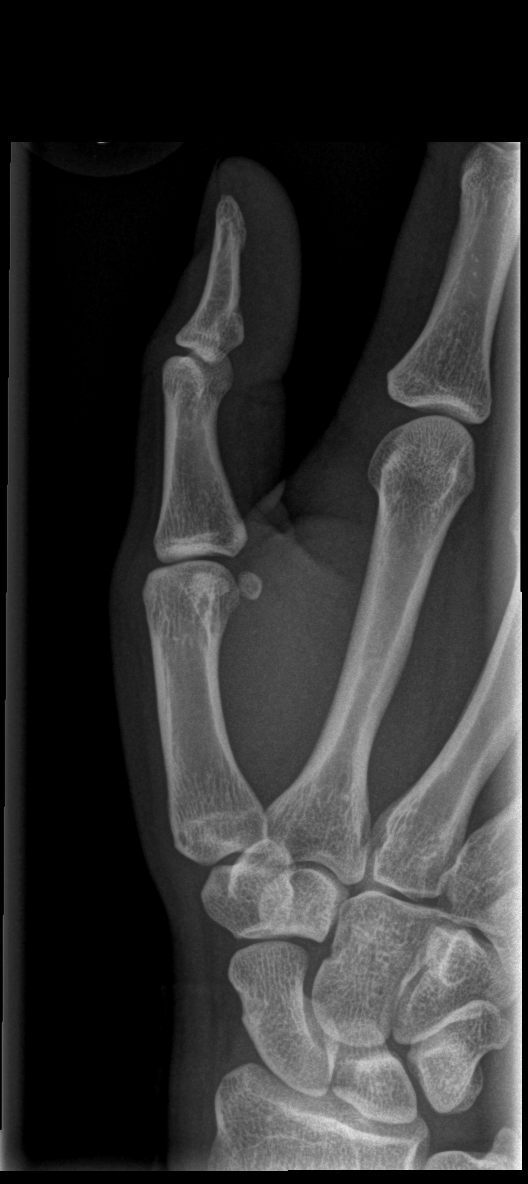

[x finger lat right]
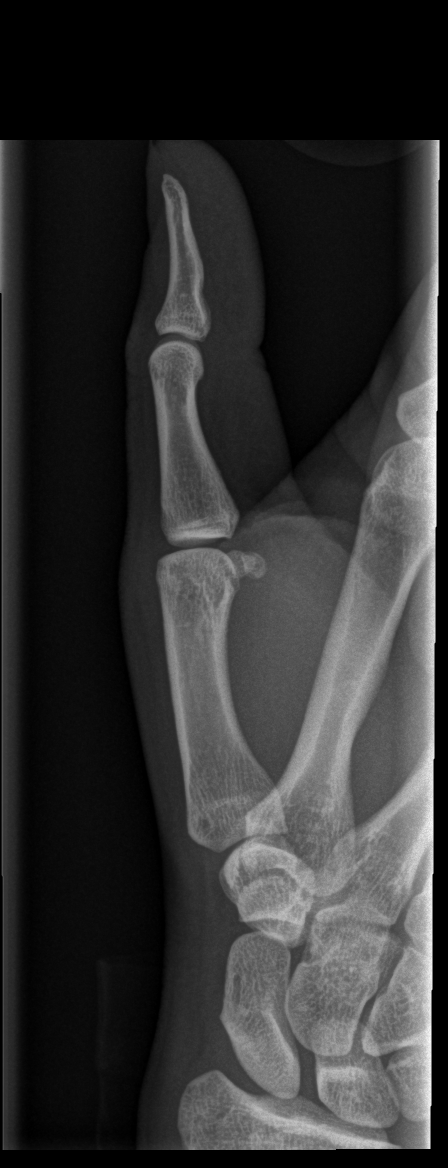

[3 of 3 positions shown; findings below may reference images not displayed]

FINDINGS: There is no evidence of fracture or dislocation. There is no
evidence of arthropathy or other focal bone abnormality. Soft
tissues are unremarkable
IMPRESSION: Negative.

## 2020-05-21 ENCOUNTER — Other Ambulatory Visit: Payer: Self-pay

## 2020-05-21 ENCOUNTER — Ambulatory Visit (HOSPITAL_COMMUNITY)
Admission: RE | Admit: 2020-05-21 | Discharge: 2020-05-21 | Disposition: A | Discharge status: Home / Self Care | Payer: Medicaid Other | Source: Ambulatory Visit | Attending: Internal Medicine | Admitting: Internal Medicine

## 2020-05-21 NOTE — ED Notes (Signed)
Patient called in lobby x 2 and on phone.  No response.

## 2020-08-14 ENCOUNTER — Other Ambulatory Visit: Payer: Self-pay | Admitting: Obstetrics & Gynecology

## 2020-09-15 ENCOUNTER — Other Ambulatory Visit (HOSPITAL_COMMUNITY): Payer: Medicaid Other

## 2020-09-15 ENCOUNTER — Encounter (HOSPITAL_COMMUNITY): Admission: RE | Admit: 2020-09-15 | Payer: Medicaid Other | Source: Ambulatory Visit

## 2020-09-15 NOTE — Progress Notes (Signed)
Epic inbasket note sent to dr pinn, pt no show for labs and covid test today, pt has not called back and several phone message left Friday and today

## 2020-09-16 NOTE — Progress Notes (Signed)
Left message with stacy pt no covid test done done for surgery tomorrow. Please call me.

## 2020-09-16 NOTE — Progress Notes (Signed)
Left message with stacy pt no show for lab and covid test, multiple messages left, please advise

## 2020-09-17 ENCOUNTER — Ambulatory Visit (HOSPITAL_BASED_OUTPATIENT_CLINIC_OR_DEPARTMENT_OTHER)
Admission: RE | Admit: 2020-09-17 | Payer: Medicaid Other | Source: Home / Self Care | Admitting: Obstetrics & Gynecology

## 2020-09-17 SURGERY — LABIAPLASTY, VULVA
Anesthesia: Choice

## 2021-07-31 ENCOUNTER — Other Ambulatory Visit: Payer: Self-pay | Admitting: Obstetrics & Gynecology

## 2022-02-10 ENCOUNTER — Other Ambulatory Visit: Payer: Self-pay | Admitting: Obstetrics and Gynecology

## 2022-03-02 ENCOUNTER — Encounter (HOSPITAL_BASED_OUTPATIENT_CLINIC_OR_DEPARTMENT_OTHER): Payer: Self-pay | Admitting: Obstetrics and Gynecology

## 2022-03-02 NOTE — Progress Notes (Signed)
Spoke w/ via phone for pre-op interview--- [facility] needs dos----CBC, BMP, UPT per surgeon.               Lab results------ COVID test -----patient states asymptomatic no test needed Arrive at -------1130 NPO after MN NO Solid Food.  Clear liquids from MN until---1030 Med rec completed Medications to take morning of surgery -----NONE Diabetic medication ----- Patient instructed no nail polish to be worn day of surgery Patient instructed to bring photo id and insurance card day of surgery Patient aware to have Driver (ride ) / caregiver Mother Laroy Apple   for 24 hours after surgery  Patient Special Instructions ----- Pre-Op special Istructions ----- Patient verbalized understanding of instructions that were given at this phone interview. Patient denies shortness of breath, chest pain, fever, cough at this phone interview.

## 2022-03-12 ENCOUNTER — Ambulatory Visit (HOSPITAL_BASED_OUTPATIENT_CLINIC_OR_DEPARTMENT_OTHER): Payer: Medicaid Other | Admitting: Anesthesiology

## 2022-03-12 ENCOUNTER — Encounter (HOSPITAL_BASED_OUTPATIENT_CLINIC_OR_DEPARTMENT_OTHER)
Admission: RE | Disposition: A | Discharge status: Home / Self Care | Payer: Self-pay | Source: Ambulatory Visit | Attending: Obstetrics and Gynecology

## 2022-03-12 ENCOUNTER — Other Ambulatory Visit: Payer: Self-pay

## 2022-03-12 ENCOUNTER — Encounter (HOSPITAL_BASED_OUTPATIENT_CLINIC_OR_DEPARTMENT_OTHER): Payer: Self-pay | Admitting: Obstetrics and Gynecology

## 2022-03-12 ENCOUNTER — Ambulatory Visit (HOSPITAL_BASED_OUTPATIENT_CLINIC_OR_DEPARTMENT_OTHER)
Admission: RE | Admit: 2022-03-12 | Discharge: 2022-03-12 | Disposition: A | Discharge status: Home / Self Care | Payer: Medicaid Other | Source: Ambulatory Visit | Attending: Obstetrics and Gynecology | Admitting: Obstetrics and Gynecology

## 2022-03-12 DIAGNOSIS — N906 Unspecified hypertrophy of vulva: Secondary | ICD-10-CM | POA: Diagnosis not present

## 2022-03-12 DIAGNOSIS — N870 Mild cervical dysplasia: Secondary | ICD-10-CM | POA: Insufficient documentation

## 2022-03-12 DIAGNOSIS — N871 Moderate cervical dysplasia: Secondary | ICD-10-CM | POA: Diagnosis not present

## 2022-03-12 HISTORY — PX: LABIOPLASTY: SHX1900

## 2022-03-12 HISTORY — PX: IUD REMOVAL: SHX5392

## 2022-03-12 HISTORY — PX: LEEP: SHX91

## 2022-03-12 LAB — CBC
HCT: 37.6 % (ref 36.0–46.0)
Hemoglobin: 11.7 g/dL — ABNORMAL LOW (ref 12.0–15.0)
MCH: 23.2 pg — ABNORMAL LOW (ref 26.0–34.0)
MCHC: 31.1 g/dL (ref 30.0–36.0)
MCV: 74.5 fL — ABNORMAL LOW (ref 80.0–100.0)
Platelets: 376 10*3/uL (ref 150–400)
RBC: 5.05 MIL/uL (ref 3.87–5.11)
RDW: 15 % (ref 11.5–15.5)
WBC: 4.2 10*3/uL (ref 4.0–10.5)
nRBC: 0 % (ref 0.0–0.2)

## 2022-03-12 LAB — BASIC METABOLIC PANEL
Anion gap: 7 (ref 5–15)
BUN: 9 mg/dL (ref 6–20)
CO2: 24 mmol/L (ref 22–32)
Calcium: 9.4 mg/dL (ref 8.9–10.3)
Chloride: 106 mmol/L (ref 98–111)
Creatinine, Ser: 0.72 mg/dL (ref 0.44–1.00)
GFR, Estimated: >60 mL/min (ref >60)
Glucose, Bld: 81 mg/dL (ref 70–99)
Potassium: 3.8 mmol/L (ref 3.5–5.1)
Sodium: 137 mmol/L (ref 135–145)

## 2022-03-12 LAB — POCT PREGNANCY, URINE: Preg Test, Ur: NEGATIVE

## 2022-03-12 SURGERY — LEEP (LOOP ELECTROSURGICAL EXCISION PROCEDURE)
Anesthesia: General | Site: Vagina

## 2022-03-12 MED ORDER — OXYCODONE HCL 5 MG/5ML PO SOLN: 5.0000 mg | Freq: Once | ORAL | Status: DC | PRN

## 2022-03-12 MED ORDER — LIDOCAINE HCL URETHRAL/MUCOSAL 2 % EX GEL
CUTANEOUS | Status: DC | PRN
  Administered 2022-03-12: 1

## 2022-03-12 MED ORDER — MIDAZOLAM HCL 2 MG/2ML IJ SOLN
INTRAMUSCULAR | Status: DC | PRN
  Administered 2022-03-12: 2 mg via INTRAVENOUS

## 2022-03-12 MED ORDER — 0.9 % SODIUM CHLORIDE (POUR BTL) OPTIME
TOPICAL | Status: DC | PRN
  Administered 2022-03-12: 500 mL

## 2022-03-12 MED ORDER — ONDANSETRON HCL 4 MG/2ML IJ SOLN: 4.0000 mg | Freq: Once | INTRAMUSCULAR | Status: DC | PRN

## 2022-03-12 MED ORDER — ACETAMINOPHEN 10 MG/ML IV SOLN
INTRAVENOUS | Status: AC
  Filled 2022-03-12: qty 100

## 2022-03-12 MED ORDER — FENTANYL CITRATE (PF) 100 MCG/2ML IJ SOLN
INTRAMUSCULAR | Status: AC
  Filled 2022-03-12: qty 2

## 2022-03-12 MED ORDER — PROPOFOL 10 MG/ML IV BOLUS
INTRAVENOUS | Status: DC | PRN
  Administered 2022-03-12: 200 mg via INTRAVENOUS
  Administered 2022-03-12: 70 mg via INTRAVENOUS

## 2022-03-12 MED ORDER — FENTANYL CITRATE (PF) 100 MCG/2ML IJ SOLN
INTRAMUSCULAR | Status: DC | PRN
  Administered 2022-03-12 (×4): 50 ug via INTRAVENOUS

## 2022-03-12 MED ORDER — DEXAMETHASONE SODIUM PHOSPHATE 10 MG/ML IJ SOLN
INTRAMUSCULAR | Status: DC | PRN
  Administered 2022-03-12: 6 mg via INTRAVENOUS

## 2022-03-12 MED ORDER — LIDOCAINE HCL (PF) 2 % IJ SOLN
INTRAMUSCULAR | Status: AC
  Filled 2022-03-12: qty 5

## 2022-03-12 MED ORDER — KETOROLAC TROMETHAMINE 30 MG/ML IJ SOLN
INTRAMUSCULAR | Status: DC | PRN
  Administered 2022-03-12: 30 mg via INTRAVENOUS

## 2022-03-12 MED ORDER — LACTATED RINGERS IV SOLN: INTRAVENOUS | Status: DC

## 2022-03-12 MED ORDER — LIDOCAINE 2% (20 MG/ML) 5 ML SYRINGE
INTRAMUSCULAR | Status: DC | PRN
  Administered 2022-03-12: 50 mg via INTRAVENOUS

## 2022-03-12 MED ORDER — HYDROMORPHONE HCL 2 MG/ML IJ SOLN
INTRAMUSCULAR | Status: AC
  Filled 2022-03-12: qty 1

## 2022-03-12 MED ORDER — IBUPROFEN 600 MG PO TABS: 600.0000 mg | ORAL_TABLET | Freq: Four times a day (QID) | ORAL | 1 refills | Status: DC | PRN

## 2022-03-12 MED ORDER — DEXAMETHASONE SODIUM PHOSPHATE 10 MG/ML IJ SOLN
INTRAMUSCULAR | Status: AC
  Filled 2022-03-12: qty 1

## 2022-03-12 MED ORDER — ACETIC ACID 4% SOLUTION
Status: DC | PRN
  Administered 2022-03-12: 1 via TOPICAL

## 2022-03-12 MED ORDER — LIDOCAINE HCL 1 % IJ SOLN
INTRAMUSCULAR | Status: DC | PRN
  Administered 2022-03-12 (×2): 10 mL

## 2022-03-12 MED ORDER — FENTANYL CITRATE (PF) 100 MCG/2ML IJ SOLN: 25.0000 ug | INTRAMUSCULAR | Status: DC | PRN

## 2022-03-12 MED ORDER — ACETAMINOPHEN 10 MG/ML IV SOLN
1000.0000 mg | Freq: Once | INTRAVENOUS | Status: DC | PRN
  Administered 2022-03-12: 1000 mg via INTRAVENOUS

## 2022-03-12 MED ORDER — MIDAZOLAM HCL 2 MG/2ML IJ SOLN
INTRAMUSCULAR | Status: AC
  Filled 2022-03-12: qty 2

## 2022-03-12 MED ORDER — ONDANSETRON HCL 4 MG/2ML IJ SOLN
INTRAMUSCULAR | Status: DC | PRN
  Administered 2022-03-12: 4 mg via INTRAVENOUS

## 2022-03-12 MED ORDER — VASOPRESSIN 20 UNIT/ML IV SOLN
INTRAVENOUS | Status: DC | PRN
  Administered 2022-03-12: 3 mL via INTRAMUSCULAR

## 2022-03-12 MED ORDER — OXYCODONE HCL 5 MG PO TABS: 5.0000 mg | ORAL_TABLET | Freq: Once | ORAL | Status: DC | PRN

## 2022-03-12 MED ORDER — FERRIC SUBSULFATE (BULK) SOLN
Status: DC | PRN
  Administered 2022-03-12: 1

## 2022-03-12 MED ORDER — POVIDONE-IODINE 10 % EX SWAB: 2.0000 | Freq: Once | CUTANEOUS | Status: DC

## 2022-03-12 MED ORDER — HYDROMORPHONE HCL 1 MG/ML IJ SOLN
INTRAMUSCULAR | Status: DC | PRN
  Administered 2022-03-12: .5 mg via INTRAVENOUS

## 2022-03-12 MED ORDER — ONDANSETRON HCL 4 MG/2ML IJ SOLN
INTRAMUSCULAR | Status: AC
  Filled 2022-03-12: qty 2

## 2022-03-12 MED ORDER — OXYCODONE HCL 5 MG PO TABS: 5.0000 mg | ORAL_TABLET | ORAL | 0 refills | Status: DC | PRN

## 2022-03-12 SURGICAL SUPPLY — 35 items
APL SWBSTK 6 STRL LF DISP (MISCELLANEOUS) ×3
APPLICATOR COTTON TIP 6 STRL (MISCELLANEOUS) ×3 IMPLANT
APPLICATOR COTTON TIP 6IN STRL (MISCELLANEOUS) ×3
BAG DECANTER FOR FLEXI CONT (MISCELLANEOUS) IMPLANT
CLEANER CAUTERY TIP 5X5 PAD (MISCELLANEOUS) ×3 IMPLANT
CNTNR URN SCR LID CUP LEK RST (MISCELLANEOUS) IMPLANT
CONT SPEC 4OZ STRL OR WHT (MISCELLANEOUS) ×3
ELECT BALL LEEP 5MM RED (ELECTRODE) IMPLANT
ELECT LOOP LEEP RND 15X12 GRN (CUTTING LOOP) ×3
ELECT REM PT RETURN 9FT ADLT (ELECTROSURGICAL) ×3
ELECTRODE LOOP LP RND 15X12GRN (CUTTING LOOP) IMPLANT
ELECTRODE REM PT RTRN 9FT ADLT (ELECTROSURGICAL) ×3 IMPLANT
GAUZE 4X4 16PLY ~~LOC~~+RFID DBL (SPONGE) IMPLANT
GLOVE BIO SURGEON STRL SZ7 (GLOVE) IMPLANT
GLOVE BIO SURGEON STRL SZ7.5 (GLOVE) ×3 IMPLANT
GLOVE BIOGEL PI IND STRL 7.0 (GLOVE) ×3 IMPLANT
GLOVE BIOGEL PI IND STRL 7.5 (GLOVE) ×6 IMPLANT
GOWN STRL REUS W/ TWL LRG LVL3 (GOWN DISPOSABLE) ×6 IMPLANT
GOWN STRL REUS W/TWL LRG LVL3 (GOWN DISPOSABLE) ×6
HIBICLENS CHG 4% 4OZ BTL (MISCELLANEOUS) ×3 IMPLANT
KIT TURNOVER CYSTO (KITS) ×3 IMPLANT
NS IRRIG 500ML POUR BTL (IV SOLUTION) IMPLANT
PACK PERINEAL COLD (PAD) IMPLANT
PACK VAGINAL WOMENS (CUSTOM PROCEDURE TRAY) ×3 IMPLANT
PAD CLEANER CAUTERY TIP 5X5 (MISCELLANEOUS) ×3
PAD OB MATERNITY 4.3X12.25 (PERSONAL CARE ITEMS) ×3 IMPLANT
PENCIL SMOKE EVACUATOR (MISCELLANEOUS) ×3 IMPLANT
SCOPETTES 8  STERILE (MISCELLANEOUS) ×6
SCOPETTES 8 STERILE (MISCELLANEOUS) ×6 IMPLANT
SUT MNCRL AB 4-0 PS2 18 (SUTURE) IMPLANT
SUT VIC AB 0 CT1 27 (SUTURE) ×3
SUT VIC AB 0 CT1 27XBRD ANBCTR (SUTURE) ×3 IMPLANT
SYR 3ML 25GX5/8 SAFETY (SYRINGE) IMPLANT
TOWEL OR 17X24 6PK STRL BLUE (TOWEL DISPOSABLE) ×6 IMPLANT
TUBE CONNECTING 12X1/4 (SUCTIONS) ×3 IMPLANT

## 2022-03-12 NOTE — Op Note (Incomplete)
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 The patient verbalized understanding and consent signed and witnessed. The patient was placed under general anesthesia per anesthesiologist and prepped and draped in the normal sterile fashion.  A TIME OUT was performed per protocol.  A weighted speculum was placed in the patient's vagina and the anterior lip of the cervix was grasped with a single tooth tenaculum.  LEEP was performed without difficulty and the bed of the cervix was cauterized with the ball tip cautery.  The bed of the cervix continued to bleed.  Dilute pitressin was injected and stitches placed at 3 and 9 O'clock, monsels solution and gelfoam applied.  Hemostasis was achieved.  A stitch was placed at 12:00 and specimen sent to pathology.  All instruments were removed. Sponge lap and needle count was correct. Patient tolerated the procedure well and was awaiting transfer to the recovery room in good condition.

## 2022-03-12 NOTE — Anesthesia Postprocedure Evaluation (Signed)
Anesthesia Post Note  Patient: Deborah Santiago  Procedure(s) Performed: LOOP ELECTROSURGICAL EXCISION PROCEDURE (LEEP) (Cervix) LABIAPLASTY PARAGARD INTRAUTERINE DEVICE (IUD) REMOVAL (Vagina )     Patient location during evaluation: PACU Anesthesia Type: General Level of consciousness: awake and alert Pain management: pain level controlled Vital Signs Assessment: post-procedure vital signs reviewed and stable Respiratory status: spontaneous breathing, nonlabored ventilation, respiratory function stable and patient connected to nasal cannula oxygen Cardiovascular status: blood pressure returned to baseline and stable Postop Assessment: no apparent nausea or vomiting Anesthetic complications: no   No notable events documented.  Last Vitals:  Vitals:   03/12/22 1155 03/12/22 1545  BP: 134/74 115/77  Pulse: 87 98  Resp: 17 16  Temp: 36.9 C 36.6 C  SpO2: 100% 100%    Last Pain:  Vitals:   03/12/22 1155  TempSrc: Oral  PainSc: 0-No pain                 Elison Worrel S

## 2022-03-12 NOTE — Anesthesia Preprocedure Evaluation (Signed)
Anesthesia Evaluation  Patient identified by MRN, date of birth, ID band Patient awake    Reviewed: Allergy & Precautions, NPO status , Patient's Chart, lab work & pertinent test results  Airway Mallampati: I  TM Distance: >3 FB Neck ROM: Full    Dental no notable dental hx.    Pulmonary neg pulmonary ROS,    Pulmonary exam normal breath sounds clear to auscultation       Cardiovascular negative cardio ROS Normal cardiovascular exam Rhythm:Regular Rate:Normal     Neuro/Psych negative neurological ROS  negative psych ROS   GI/Hepatic negative GI ROS, Neg liver ROS,   Endo/Other  negative endocrine ROS  Renal/GU negative Renal ROS  negative genitourinary   Musculoskeletal negative musculoskeletal ROS (+)   Abdominal   Peds negative pediatric ROS (+)  Hematology negative hematology ROS (+)   Anesthesia Other Findings   Reproductive/Obstetrics negative OB ROS                             Anesthesia Physical Anesthesia Plan  ASA: 1  Anesthesia Plan: General   Post-op Pain Management: Minimal or no pain anticipated and Toradol IV (intra-op)*   Induction: Intravenous  PONV Risk Score and Plan: 3 and Ondansetron, Dexamethasone, Treatment may vary due to age or medical condition and Midazolam  Airway Management Planned: LMA  Additional Equipment:   Intra-op Plan:   Post-operative Plan: Extubation in OR  Informed Consent: I have reviewed the patients History and Physical, chart, labs and discussed the procedure including the risks, benefits and alternatives for the proposed anesthesia with the patient or authorized representative who has indicated his/her understanding and acceptance.     Dental advisory given  Plan Discussed with: CRNA and Surgeon  Anesthesia Plan Comments:         Anesthesia Quick Evaluation

## 2022-03-12 NOTE — H&P (Signed)
Deborah Santiago is an 21 y.o. female. Pt presents today for scheduled LEEP and labiaplasty.  Pertinent Gynecological History:  OB History: G3, [id number]   Menstrual History:  Patient's last menstrual period was 02/25/2022 (exact date).    Past Medical History:  Diagnosis Date   History of HELLP syndrome, currently pregnant    History of pre-eclampsia   Menstrual HAs relieved with OTC meds  Past Surgical History:  Procedure Laterality Date   CESAREAN SECTION N/A 10/28/2015   Procedure: CESAREAN SECTION;  Surgeon: Huel Cote, MD;  Location: [facility] BIRTHING SUITES;  Service: Obstetrics;  Laterality: N/A;   CESAREAN SECTION N/A 08/10/2019   Procedure: CESAREAN SECTION;  Surgeon: Essie Hart, MD;  Location: [facility] LD ORS;  Service: Obstetrics;  Laterality: N/A;   WISDOM TOOTH EXTRACTION      Family History  Problem Relation Age of Onset   Hypertension Mother     Social History:  reports that she has never smoked. She has never used smokeless tobacco. She reports current alcohol use. She reports that she does not use drugs.  Allergies: No Known Allergies  Medications Prior to Admission  Medication Sig Dispense Refill Last Dose   paragard intrauterine copper IUD IUD 1 each by Intrauterine route once. Inserted 2021   03/12/2022   ibuprofen (ADVIL) 600 MG tablet Take 1 tablet (600 mg total) by mouth every 6 (six) hours. 30 tablet 0    oxyCODONE (OXY IR/ROXICODONE) 5 MG immediate release tablet Take 1-2 tablets (5-10 mg total) by mouth every 4 (four) hours as needed for moderate pain. 30 tablet 0     Review of Systems  Blood pressure 134/74, pulse 87, temperature 98.4 F (36.9 C), temperature source Oral, resp. rate 17, height 5\' 3"  (1.6 m), weight 68.2 kg, last menstrual period 02/25/2022, SpO2 100 %, unknown if currently breastfeeding. Physical Exam  Lungs CTA CV RRR Abdomen soft, NT Ext no calf tenderness  Results for orders placed or performed during the hospital encounter  of 03/12/22 (from the past 24 hour(s))  Pregnancy, urine POC     Status: None   Collection Time: 03/12/22 11:42 AM  Result Value Ref Range   Preg Test, Ur NEGATIVE NEGATIVE  CBC     Status: Abnormal   Collection Time: 03/12/22 12:17 PM  Result Value Ref Range   WBC 4.2 4.0 - 10.5 K/uL   RBC 5.05 3.87 - 5.11 MIL/uL   Hemoglobin 11.7 (L) 12.0 - 15.0 g/dL   HCT 03/14/22 42.3 - 53.6 %   MCV 74.5 (L) 80.0 - 100.0 fL   MCH 23.2 (L) 26.0 - 34.0 pg   MCHC 31.1 30.0 - 36.0 g/dL   RDW 14.4 31.5 - 40.0 %   Platelets 376 150 - 400 K/uL   nRBC 0.0 0.0 - 0.2 %  Basic metabolic panel     Status: None   Collection Time: 03/12/22 12:17 PM  Result Value Ref Range   Sodium 137 135 - 145 mmol/L   Potassium 3.8 3.5 - 5.1 mmol/L   Chloride 106 98 - 111 mmol/L   CO2 24 22 - 32 mmol/L   Glucose, Bld 81 70 - 99 mg/dL   BUN 9 6 - 20 mg/dL   Creatinine, Ser 03/14/22 0.44 - 1.00 mg/dL   Calcium 9.4 8.9 - 6.19 mg/dL   GFR, Estimated 50.9 >32 mL/min   Anion gap 7 5 - 15    No results found.  Assessment/Plan: P2 presenting for LEEP and Labiaplasty.  Risks  benefits alternatives reviewed with the patient including but not limited to bleeding infection and injury.  Questions answered and consent signed and witnessed.  Delice Lesch 03/12/2022, 1:42 PM

## 2022-03-12 NOTE — Anesthesia Procedure Notes (Signed)
Procedure Name: LMA Insertion Date/Time: 03/12/2022 1:18 PM  Performed by: Mechele Claude, CRNAPre-anesthesia Checklist: Patient identified, Emergency Drugs available, Suction available and Patient being monitored Patient Re-evaluated:Patient Re-evaluated prior to induction Oxygen Delivery Method: Circle system utilized Preoxygenation: Pre-oxygenation with 100% oxygen Induction Type: IV induction Ventilation: Mask ventilation without difficulty LMA: LMA inserted LMA Size: 4.0 Number of attempts: 1 Airway Equipment and Method: Bite block Placement Confirmation: positive ETCO2 Tube secured with: Tape Dental Injury: Teeth and Oropharynx as per pre-operative assessment

## 2022-03-12 NOTE — Discharge Instructions (Addendum)
     No ibuprofen, Advil, Aleve, Motrin, ketorolac, meloxicam, naproxen, or other NSAIDS until after 9:37 pm today if needed.      Post Anesthesia Home Care Instructions  Activity: Get plenty of rest for the remainder of the day. A responsible individual must stay with you for 24 hours following the procedure.  For the next 24 hours, DO NOT: -Drive a car -Paediatric nurse -Drink alcoholic beverages -Take any medication unless instructed by your physician -Make any legal decisions or sign important papers.  Meals: Start with liquid foods such as gelatin or soup. Progress to regular foods as tolerated. Avoid greasy, spicy, heavy foods. If nausea and/or vomiting occur, drink only clear liquids until the nausea and/or vomiting subsides. Call your physician if vomiting continues.  Special Instructions/Symptoms: Your throat may feel dry or sore from the anesthesia or the breathing tube placed in your throat during surgery. If this causes discomfort, gargle with warm salt water. The discomfort should disappear within 24 hours.

## 2022-03-12 NOTE — Transfer of Care (Signed)
Immediate Anesthesia Transfer of Care Note  Patient: ELIANY MCCARTER  Procedure(s) Performed: Procedure(s) (LRB): LOOP ELECTROSURGICAL EXCISION PROCEDURE (LEEP) (N/A) LABIAPLASTY (N/A) PARAGARD INTRAUTERINE DEVICE (IUD) REMOVAL (N/A)  Patient Location: PACU  Anesthesia Type: General  Level of Consciousness: awake, alert  and oriented  Airway & Oxygen Therapy: Patient Spontanous Breathing and Patient connected to face mask oxygen  Post-op Assessment: Report given to PACU RN and Post -op Vital signs reviewed and stable  Post vital signs: Reviewed and stable  Complications: No apparent anesthesia complications  Last Vitals:  Vitals Value Taken Time  BP 115/77 03/12/22 1545  Temp 36.6 C 03/12/22 1545  Pulse 91 03/12/22 1549  Resp 12 03/12/22 1549  SpO2 100 % 03/12/22 1549  Vitals shown include unvalidated device data.  Last Pain:  Vitals:   03/12/22 1155  TempSrc: Oral  PainSc: 0-No pain      Patients Stated Pain Goal: 3 (68/61/68 3729)  Complications: No notable events documented.

## 2022-03-15 ENCOUNTER — Encounter (HOSPITAL_BASED_OUTPATIENT_CLINIC_OR_DEPARTMENT_OTHER): Payer: Self-pay | Admitting: Obstetrics and Gynecology

## 2022-03-17 LAB — SURGICAL PATHOLOGY

## 2022-04-13 NOTE — Op Note (Signed)
Preop Diagnosis: MODERATE CERVICAL DYSPLASIA  HYPERTROPHY OF VULVA   Postop Diagnosis: MODERATE CERVICAL DYSPLASIA  HYPERTROPHY    Procedure: LOOP ELECTROSURGICAL EXCISION PROCEDURE (LEEP) LABIAPLASTY   Anesthesia: General   Anesthesiologist: See flowsheet  Attending: Osborn Coho, MD  Assistant: N/a  Findings: Hypertrophied labia  Pathology: LEEP specimen  Fluids: See flowsheet  UOP: See flowsheet  EBL: See flowsheet  Complications: None  Procedure:  The patient was taken to the operating room after the risks, benefits and alternatives were discussed with the patient. The patient verbalized understanding and consent signed and witnessed. The patient was placed under general anesthesia per anesthesiologist and prepped and draped in the normal sterile fashion.  A TIME OUT was performed per protocol.  A weighted speculum was placed in the patient's vagina and the anterior lip of the cervix was grasped with a single tooth tenaculum.  LEEP was performed without difficulty and the bed of the cervix was cauterized with the ball tip cautery.  The bed of the cervix continued to bleed.  Dilute pitressin was injected and stitches placed at 3 and 9 O'clock, monsels solution and gelfoam applied.  Hemostasis was achieved.  A stitch was placed at 12:00 and specimen sent to pathology.  All instruments were removed.   Labia was identified and the excess tissue on left labia majora was excised and sutured via a subcuticular stitch with 3-0 vicryl.  Same was done on the contralateral side.  Sponge lap and needle count was correct. Patient tolerated the procedure well and was awaiting transfer to the recovery room in good condition.

## 2022-05-19 ENCOUNTER — Other Ambulatory Visit: Payer: Self-pay

## 2022-05-19 ENCOUNTER — Emergency Department (HOSPITAL_BASED_OUTPATIENT_CLINIC_OR_DEPARTMENT_OTHER): Payer: Medicaid Other

## 2022-05-19 ENCOUNTER — Encounter (HOSPITAL_BASED_OUTPATIENT_CLINIC_OR_DEPARTMENT_OTHER): Payer: Self-pay | Admitting: Emergency Medicine

## 2022-05-19 ENCOUNTER — Emergency Department (HOSPITAL_BASED_OUTPATIENT_CLINIC_OR_DEPARTMENT_OTHER)
Admission: EM | Admit: 2022-05-19 | Discharge: 2022-05-19 | Disposition: A | Discharge status: Home / Self Care | Payer: Medicaid Other | Attending: Emergency Medicine | Admitting: Emergency Medicine

## 2022-05-19 DIAGNOSIS — M79602 Pain in left arm: Secondary | ICD-10-CM | POA: Diagnosis present

## 2022-05-19 DIAGNOSIS — Y92410 Unspecified street and highway as the place of occurrence of the external cause: Secondary | ICD-10-CM | POA: Diagnosis not present

## 2022-05-19 MED ORDER — NAPROXEN 500 MG PO TABS: 500.0000 mg | ORAL_TABLET | Freq: Two times a day (BID) | ORAL | 0 refills | Status: DC

## 2022-05-19 MED ORDER — CYCLOBENZAPRINE HCL 10 MG PO TABS: 10.0000 mg | ORAL_TABLET | Freq: Two times a day (BID) | ORAL | 0 refills | Status: DC | PRN
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
## 2022-05-19 NOTE — ED Notes (Signed)
Patient verbalizes understanding of discharge instructions. Opportunity for questioning and answers were provided. Patient discharged from ED.  °

## 2022-05-19 NOTE — Discharge Instructions (Signed)
Your x-ray was reassuring today.  Please take tylenol/ibuprofen or Flexeril as needed for pain. I recommend close follow-up with PCP for reevaluation.  Please do not hesitate to return to emergency department if worrisome signs symptoms we discussed become apparent.

## 2023-05-26 LAB — OB RESULTS CONSOLE GC/CHLAMYDIA
Chlamydia: NEGATIVE
Neisseria Gonorrhea: NEGATIVE

## 2023-05-26 LAB — OB RESULTS CONSOLE ANTIBODY SCREEN: Antibody Screen: NEGATIVE

## 2023-05-26 LAB — OB RESULTS CONSOLE RUBELLA ANTIBODY, IGM: Rubella: NON-IMMUNE/NOT IMMUNE

## 2023-05-26 LAB — OB RESULTS CONSOLE HIV ANTIBODY (ROUTINE TESTING): HIV: NONREACTIVE

## 2023-05-26 LAB — OB RESULTS CONSOLE HEPATITIS B SURFACE ANTIGEN: Hepatitis B Surface Ag: NEGATIVE

## 2023-05-26 LAB — HEPATITIS C ANTIBODY: HCV Ab: NEGATIVE

## 2023-05-26 LAB — OB RESULTS CONSOLE RPR: RPR: NONREACTIVE

## 2023-10-04 ENCOUNTER — Telehealth: Payer: Self-pay

## 2023-10-04 ENCOUNTER — Other Ambulatory Visit: Payer: Self-pay

## 2023-10-04 ENCOUNTER — Other Ambulatory Visit: Payer: Self-pay | Admitting: Obstetrics and Gynecology

## 2023-10-04 DIAGNOSIS — O365990 Maternal care for other known or suspected poor fetal growth, unspecified trimester, not applicable or unspecified: Secondary | ICD-10-CM

## 2023-10-07 ENCOUNTER — Encounter: Payer: Self-pay | Admitting: *Deleted

## 2023-10-07 DIAGNOSIS — Z9889 Other specified postprocedural states: Secondary | ICD-10-CM | POA: Insufficient documentation

## 2023-10-07 DIAGNOSIS — O09293 Supervision of pregnancy with other poor reproductive or obstetric history, third trimester: Secondary | ICD-10-CM | POA: Insufficient documentation

## 2023-10-07 DIAGNOSIS — O09893 Supervision of other high risk pregnancies, third trimester: Secondary | ICD-10-CM | POA: Insufficient documentation

## 2023-10-07 DIAGNOSIS — O34219 Maternal care for unspecified type scar from previous cesarean delivery: Secondary | ICD-10-CM | POA: Insufficient documentation

## 2023-10-07 DIAGNOSIS — E559 Vitamin D deficiency, unspecified: Secondary | ICD-10-CM | POA: Insufficient documentation

## 2023-10-07 DIAGNOSIS — O365930 Maternal care for other known or suspected poor fetal growth, third trimester, not applicable or unspecified: Secondary | ICD-10-CM | POA: Insufficient documentation

## 2023-10-11 ENCOUNTER — Other Ambulatory Visit: Payer: Self-pay | Admitting: Obstetrics and Gynecology

## 2023-10-11 ENCOUNTER — Ambulatory Visit (HOSPITAL_BASED_OUTPATIENT_CLINIC_OR_DEPARTMENT_OTHER)

## 2023-10-11 ENCOUNTER — Ambulatory Visit (HOSPITAL_BASED_OUTPATIENT_CLINIC_OR_DEPARTMENT_OTHER): Admitting: Obstetrics and Gynecology

## 2023-10-11 ENCOUNTER — Ambulatory Visit: Attending: Obstetrics and Gynecology

## 2023-10-11 ENCOUNTER — Other Ambulatory Visit: Payer: Self-pay | Admitting: *Deleted

## 2023-10-11 VITALS — BP 114/72 | HR 105

## 2023-10-11 DIAGNOSIS — Z3A29 29 weeks gestation of pregnancy: Secondary | ICD-10-CM

## 2023-10-11 DIAGNOSIS — O365930 Maternal care for other known or suspected poor fetal growth, third trimester, not applicable or unspecified: Secondary | ICD-10-CM | POA: Diagnosis present

## 2023-10-11 DIAGNOSIS — O09213 Supervision of pregnancy with history of pre-term labor, third trimester: Secondary | ICD-10-CM | POA: Diagnosis not present

## 2023-10-11 DIAGNOSIS — O34219 Maternal care for unspecified type scar from previous cesarean delivery: Secondary | ICD-10-CM | POA: Diagnosis present

## 2023-10-11 DIAGNOSIS — O09293 Supervision of pregnancy with other poor reproductive or obstetric history, third trimester: Secondary | ICD-10-CM | POA: Diagnosis not present

## 2023-10-11 DIAGNOSIS — O3443 Maternal care for other abnormalities of cervix, third trimester: Secondary | ICD-10-CM

## 2023-10-11 DIAGNOSIS — O365990 Maternal care for other known or suspected poor fetal growth, unspecified trimester, not applicable or unspecified: Secondary | ICD-10-CM | POA: Insufficient documentation

## 2023-10-11 NOTE — Procedures (Signed)
 Deborah Santiago Oct 07, 2000 [redacted]w[redacted]d  Fetus A Non-Stress Test Interpretation for 10/11/23  Indication: IUGR  Fetal Heart Rate A Mode: External Baseline Rate (A): 145 bpm Variability: Moderate Accelerations: 10 x 10 Decelerations: None Multiple birth?: No  Uterine Activity Mode: Toco, Palpation Contraction Frequency (min): one ctx noted Contraction Duration (sec): 80 Contraction Quality:  (not palpable - pt reports not feeling) Resting Tone Palpated: Relaxed  Interpretation (Fetal Testing) Nonstress Test Interpretation: Reactive Comments: Reviewed with Dr. Arnie Bibber

## 2023-10-11 NOTE — Progress Notes (Signed)
  Maternal-Fetal Medicine Consultation Name: Deborah Santiago MRN: [id number]  G3 P1102 at 29w 1d gestation.  Patient is here for a second opinion ultrasound.  At your office ultrasound, fetal growth restriction was suspected. On cell free fetal DNA screening, the risks of fetal aneuploidies are not increased. Patient will be undergoing screening for gestational diabetes today or tomorrow.  Obstetric history Preterm cesarean delivery delivery in 2017 at [redacted] weeks gestation of an infant weighing 3 pounds and 14 ounces at birth.  Her pregnancy was complicated by HELLP syndrome. In 2021, she had a term repeat cesarean delivery.  Patient takes low-dose aspirin prophylaxis.  Ultrasound On today's ultrasound, the estimated fetal weight is at the 2nd percentile and the abdominal circumference measurement is at the 1st percentile.  Amniotic fluid is normal good fetal activity seen.  Fetal anatomical survey appears normal but limited by advanced gestational age. Umbilical artery Doppler showed normal forward diastolic flow.  NST is reactive. Placenta is posterior and there is no evidence of previa or placenta accreta spectrum.  Our concerns include: Fetal growth restriction I explained the finding of fetal growth restriction that is difficult to differentiate from a constitutionally small for gestational age fetus. I discussed the possible causes of fetal growth restriction including placental insufficiency (most common cause), chromosomal anomalies and fetal infections.  Patient did not give history of fever or rashes. I explained that only amniocentesis will give a definitive result on the fetal karyotype and some genetic conditions (Microarray).  I explained amniocentesis procedure and possible complication of preterm delivery (1 and 500 procedures). Patient opted not to have amniocentesis.  I discussed ultrasound protocol of monitoring fetal growth restriction.  Timing of delivery: Since small  for gestational age fetuses have a higher chance of having perinatal mortality and morbidity, delivery at 88 to [redacted] weeks gestation is reasonable.  If, however, severe fetal growth restriction persists with normal antenatal testing, we will recommend delivery at [redacted] weeks gestation.  History of HELLP syndrome/preeclampsia with severe features I counseled the patient that recurrence of preeclampsia is seen in about 25% to 50% of cases.  However, the likelihood of having Syndrome is less than 5%.  I discussed the benefit of low-dose aspirin prophylaxis that delays or prevents preeclampsia. Patient was counseled that fetal growth restriction can proceed gestational hypertension/preeclampsia some cases.  Recommendations - Weekly UA Doppler and NST for the next 2 weeks. - Fetal growth assessment and BPP in 3 weeks.  Consultation including face-to-face (more than 50%) counseling 30 minutes.

## 2023-10-21 ENCOUNTER — Ambulatory Visit (HOSPITAL_BASED_OUTPATIENT_CLINIC_OR_DEPARTMENT_OTHER)

## 2023-10-21 ENCOUNTER — Ambulatory Visit: Attending: Obstetrics and Gynecology | Admitting: Obstetrics and Gynecology

## 2023-10-21 ENCOUNTER — Other Ambulatory Visit: Payer: Self-pay | Admitting: *Deleted

## 2023-10-21 VITALS — BP 109/60 | HR 80

## 2023-10-21 DIAGNOSIS — O09213 Supervision of pregnancy with history of pre-term labor, third trimester: Secondary | ICD-10-CM | POA: Diagnosis not present

## 2023-10-21 DIAGNOSIS — O34219 Maternal care for unspecified type scar from previous cesarean delivery: Secondary | ICD-10-CM

## 2023-10-21 DIAGNOSIS — O365930 Maternal care for other known or suspected poor fetal growth, third trimester, not applicable or unspecified: Secondary | ICD-10-CM

## 2023-10-21 DIAGNOSIS — O09293 Supervision of pregnancy with other poor reproductive or obstetric history, third trimester: Secondary | ICD-10-CM

## 2023-10-21 DIAGNOSIS — Z9889 Other specified postprocedural states: Secondary | ICD-10-CM

## 2023-10-21 DIAGNOSIS — Z3A30 30 weeks gestation of pregnancy: Secondary | ICD-10-CM

## 2023-10-21 DIAGNOSIS — O3443 Maternal care for other abnormalities of cervix, third trimester: Secondary | ICD-10-CM | POA: Insufficient documentation

## 2023-10-21 DIAGNOSIS — O09893 Supervision of other high risk pregnancies, third trimester: Secondary | ICD-10-CM

## 2023-10-21 NOTE — Progress Notes (Signed)
 After review, MFM consult with provider is not indicated for today  Deborah Clever, MD 10/21/2023 12:08 PM  [facility]

## 2023-10-21 NOTE — Procedures (Signed)
 Deborah Santiago January 19, 2001 [redacted]w[redacted]d  Fetus A Non-Stress Test Interpretation for 10/21/23  Indication: IUGR  Fetal Heart Rate A Mode: External Baseline Rate (A): 140 bpm Variability: Moderate Accelerations: 10 x 10 Decelerations: None Multiple birth?: No  Uterine Activity Mode: Toco, Palpation Contraction Frequency (min): None noted Resting Tone Palpated: Relaxed  Interpretation (Fetal Testing) Nonstress Test Interpretation: Reactive Comments: Reviewed with Dr. Arnie Bibber

## 2023-11-18 ENCOUNTER — Other Ambulatory Visit: Payer: Self-pay | Admitting: Obstetrics and Gynecology

## 2023-11-18 ENCOUNTER — Other Ambulatory Visit: Payer: Self-pay | Admitting: *Deleted

## 2023-11-18 ENCOUNTER — Ambulatory Visit: Attending: Obstetrics and Gynecology | Admitting: Maternal & Fetal Medicine

## 2023-11-18 ENCOUNTER — Ambulatory Visit (HOSPITAL_BASED_OUTPATIENT_CLINIC_OR_DEPARTMENT_OTHER)

## 2023-11-18 VITALS — BP 107/57 | HR 127

## 2023-11-18 DIAGNOSIS — O365930 Maternal care for other known or suspected poor fetal growth, third trimester, not applicable or unspecified: Secondary | ICD-10-CM

## 2023-11-18 DIAGNOSIS — O09293 Supervision of pregnancy with other poor reproductive or obstetric history, third trimester: Secondary | ICD-10-CM | POA: Diagnosis not present

## 2023-11-18 DIAGNOSIS — O09213 Supervision of pregnancy with history of pre-term labor, third trimester: Secondary | ICD-10-CM

## 2023-11-18 DIAGNOSIS — O3443 Maternal care for other abnormalities of cervix, third trimester: Secondary | ICD-10-CM

## 2023-11-18 DIAGNOSIS — Z3A34 34 weeks gestation of pregnancy: Secondary | ICD-10-CM | POA: Diagnosis not present

## 2023-11-18 DIAGNOSIS — O34219 Maternal care for unspecified type scar from previous cesarean delivery: Secondary | ICD-10-CM

## 2023-11-18 NOTE — Progress Notes (Signed)
   Patient information  Patient Name: Deborah Santiago  Patient MRN:   [id number]  Referring practice: MFM Referring Provider: [facility] ([facility])  Problem List   Patient Active Problem List   Diagnosis Date Noted   Vitamin D deficiency 10/07/2023   History of LEEP (loop electrosurgical excision procedure) of cervix complicating pregnancy in third trimester 10/07/2023   History of HELLP syndrome, currently pregnant, third trimester 10/07/2023   History of premature delivery, currently pregnant, third trimester 10/07/2023   Pregnancy with history of cesarean section, antepartum 10/07/2023   IUGR (intrauterine growth restriction) affecting care of mother, third trimester, not applicable or unspecified fetus 10/07/2023   Cesarean delivery - failed TOLAC, AOD 6 cm, face presentation 08/11/2019   Maternal Fetal medicine Consult  Deborah Santiago is a 23 y.o. [id number] at [redacted]w[redacted]d here for ultrasound and consultation. Deborah Santiago is doing well today with no acute concerns. Today we focused on the following:   FGR: The fetus continues to show severe FGR but normal UA dopplers and BPP which is reassuring. Due to the EFW < 3rd percentile a 37 week delivery is recommended.   Sonographic findings Single intrauterine pregnancy at 34w 4d. Fetal cardiac activity:  Observed and appears normal. Presentation: Cephalic. Interval fetal anatomy appears normal. Fetal biometry shows the estimated fetal weight at the 2.5 percentile and the abdominal circumference at the 2.1 percentile.  Amniotic fluid: Within normal limits.  MVP: 6.47 cm. Placenta: Posterior. Umbilical artery dopplers findings: -S/D:2.9 which are normal at this gestational age.  -Absent end-diastolic flow: No.  -Reversed end-diastolic flow:  No. BPP 8/8.   Recommendations - Continue weekly UA dopplers and antenatal testing.  - Delivery likely around 37 weeks or sooner if indicated.   I discussed the limitations of  prenatal ultrasound with the patient including inability to detect certain abnormalities and poor visualization of certain anatomic structures due to fetal position, gestational age and maternal body habitus.  The patient verbalized understanding and had time to ask questions that were answered to her satisfaction.   Review of Systems: A review of systems was performed and was negative except per HPI   Vitals and Physical Exam    11/18/2023   12:59 PM 10/21/2023   10:09 AM 10/11/2023    9:00 AM  Vitals with BMI  Systolic 107 109 885  Diastolic 57 60 72  Pulse 127 80 105    Sitting comfortably on the sonogram table Nonlabored breathing Normal rate and rhythm Abdomen is nontender  Past pregnancies OB History  Gravida Para Term Preterm AB Living  3 2 1 1  2   SAB IAB Ectopic Multiple Live Births     0 2    # Outcome Date GA Lbr Len/2nd Weight Sex Type Anes PTL Lv  3 Current           2 Term 08/10/19 [redacted]w[redacted]d  7 lb 9.8 oz (3.453 kg) M CS-Vac EPI  LIV  1 Preterm 10/28/15 [redacted]w[redacted]d  3 lb 13.7 oz (1.75 kg) M CS-Vac Spinal  LIV     I spent 20 minutes reviewing the patients chart, including labs and images as well as counseling the patient about her medical conditions. Greater than 50% of the time was spent in direct face-to-face patient counseling.  Delora Smaller  MFM, [place]   11/18/2023  2:02 PM

## 2023-11-28 ENCOUNTER — Other Ambulatory Visit: Payer: Self-pay | Admitting: Obstetrics and Gynecology

## 2023-11-30 ENCOUNTER — Encounter (HOSPITAL_COMMUNITY): Payer: Self-pay

## 2023-11-30 ENCOUNTER — Telehealth (HOSPITAL_COMMUNITY): Payer: Self-pay | Admitting: *Deleted

## 2023-11-30 NOTE — Telephone Encounter (Signed)
 Preadmission screen

## 2023-11-30 NOTE — Patient Instructions (Signed)
 Deborah Santiago  11/30/2023   Your procedure is scheduled on:  12/05/2023  Arrive at 2:00 PM at Entrance C on [address] at [facility]  and [employer]. You are invited to use the FREE valet parking or use the Visitor's parking deck.  Pick up the phone at the desk and dial [phone].  Call this number if you have problems the morning of surgery: [phone]  Remember:   Do not eat food:(After Midnight) Desps de medianoche.  You may drink clear liquids until  __1200___.  Clear liquids means a liquid you can see thru.  It can have color such as Cola or Kool aid.  Tea is OK and coffee as long as no milk or creamer of any kind.  Take these medicines the morning of surgery with A SIP OF WATER:  none   Do not wear jewelry, make-up or nail polish.  Do not wear lotions, powders, or perfumes. Do not wear deodorant.  Do not shave 48 hours prior to surgery.  Do not bring valuables to the hospital.  [facility] is not   responsible for any belongings or valuables brought to the hospital.  Contacts, dentures or bridgework may not be worn into surgery.  Leave suitcase in the car. After surgery it may be brought to your room.  For patients admitted to the hospital, checkout time is 11:00 AM the day of              discharge.      Please read over the following fact sheets that you were given:     Preparing for Surgery

## 2023-12-02 ENCOUNTER — Encounter (HOSPITAL_COMMUNITY)
Admission: RE | Admit: 2023-12-02 | Discharge: 2023-12-02 | Disposition: A | Discharge status: Home / Self Care | Source: Ambulatory Visit | Attending: Obstetrics and Gynecology

## 2023-12-02 DIAGNOSIS — Z01812 Encounter for preprocedural laboratory examination: Secondary | ICD-10-CM | POA: Insufficient documentation

## 2023-12-02 HISTORY — DX: Unspecified abnormal cytological findings in specimens from vagina: R87.629

## 2023-12-02 LAB — TYPE AND SCREEN
ABO/RH(D): B POS
Antibody Screen: NEGATIVE

## 2023-12-02 LAB — CBC
HCT: 31.7 % — ABNORMAL LOW (ref 36.0–46.0)
Hemoglobin: 10.2 g/dL — ABNORMAL LOW (ref 12.0–15.0)
MCH: 25.1 pg — ABNORMAL LOW (ref 26.0–34.0)
MCHC: 32.2 g/dL (ref 30.0–36.0)
MCV: 78.1 fL — ABNORMAL LOW (ref 80.0–100.0)
Platelets: 210 K/uL (ref 150–400)
RBC: 4.06 MIL/uL (ref 3.87–5.11)
RDW: 16.6 % — ABNORMAL HIGH (ref 11.5–15.5)
WBC: 6 K/uL (ref 4.0–10.5)
nRBC: 0 % (ref 0.0–0.2)

## 2023-12-02 LAB — BASIC METABOLIC PANEL WITH GFR
Anion gap: 10 (ref 5–15)
BUN: 6 mg/dL (ref 6–20)
CO2: 20 mmol/L — ABNORMAL LOW (ref 22–32)
Calcium: 8.8 mg/dL — ABNORMAL LOW (ref 8.9–10.3)
Chloride: 105 mmol/L (ref 98–111)
Creatinine, Ser: 0.61 mg/dL (ref 0.44–1.00)
GFR, Estimated: >60 mL/min (ref >60)
Glucose, Bld: 68 mg/dL — ABNORMAL LOW (ref 70–99)
Potassium: 3.8 mmol/L (ref 3.5–5.1)
Sodium: 135 mmol/L (ref 135–145)

## 2023-12-03 LAB — RPR: RPR Ser Ql: NONREACTIVE

## 2023-12-04 ENCOUNTER — Encounter (HOSPITAL_COMMUNITY): Payer: Self-pay | Admitting: Obstetrics and Gynecology

## 2023-12-04 NOTE — Anesthesia Preprocedure Evaluation (Signed)
 Anesthesia Evaluation  Patient identified by MRN, date of birth, ID band Patient awake    Reviewed: Allergy & Precautions, NPO status , Patient's Chart, lab work & pertinent test results  Airway Mallampati: II  TM Distance: >3 FB     Dental no notable dental hx. (+) Teeth Intact, Dental Advisory Given   Pulmonary neg pulmonary ROS   Pulmonary exam normal breath sounds clear to auscultation       Cardiovascular negative cardio ROS Normal cardiovascular exam Rhythm:Regular Rate:Normal     Neuro/Psych negative neurological ROS  negative psych ROS   GI/Hepatic Neg liver ROS,GERD  ,,  Endo/Other  negative endocrine ROS    Renal/GU negative Renal ROS  negative genitourinary   Musculoskeletal negative musculoskeletal ROS (+)    Abdominal   Peds  Hematology  (+) Blood dyscrasia, anemia   Anesthesia Other Findings   Reproductive/Obstetrics (+) Pregnancy Hx/o previous C/Section x 2 Hx/o HELLP syndrome prior pregnancy                              Anesthesia Physical Anesthesia Plan  ASA: 2  Anesthesia Plan: Spinal   Post-op Pain Management: Minimal or no pain anticipated   Induction: Intravenous  PONV Risk Score and Plan: 4 or greater and Treatment may vary due to age or medical condition  Airway Management Planned: Natural Airway  Additional Equipment: None and Fetal Monitoring  Intra-op Plan:   Post-operative Plan:   Informed Consent: I have reviewed the patients History and Physical, chart, labs and discussed the procedure including the risks, benefits and alternatives for the proposed anesthesia with the patient or authorized representative who has indicated his/her understanding and acceptance.     Dental advisory given  Plan Discussed with: CRNA and Anesthesiologist  Anesthesia Plan Comments:          Anesthesia Quick Evaluation

## 2023-12-05 ENCOUNTER — Encounter (HOSPITAL_COMMUNITY): Payer: Self-pay | Admitting: Obstetrics and Gynecology

## 2023-12-05 ENCOUNTER — Encounter (HOSPITAL_COMMUNITY)
Admission: RE | Disposition: A | Discharge status: Home / Self Care | Payer: Self-pay | Source: Home / Self Care | Attending: Obstetrics and Gynecology

## 2023-12-05 ENCOUNTER — Inpatient Hospital Stay (HOSPITAL_COMMUNITY): Payer: Self-pay | Admitting: Anesthesiology

## 2023-12-05 ENCOUNTER — Other Ambulatory Visit: Payer: Self-pay

## 2023-12-05 ENCOUNTER — Other Ambulatory Visit: Payer: Self-pay | Admitting: Obstetrics and Gynecology

## 2023-12-05 ENCOUNTER — Inpatient Hospital Stay (HOSPITAL_COMMUNITY)
Admission: RE | Admit: 2023-12-05 | Discharge: 2023-12-08 | DRG: 787 | Disposition: A | Discharge status: Home / Self Care | Attending: Obstetrics and Gynecology | Admitting: Obstetrics and Gynecology

## 2023-12-05 DIAGNOSIS — Z8249 Family history of ischemic heart disease and other diseases of the circulatory system: Secondary | ICD-10-CM

## 2023-12-05 DIAGNOSIS — O365930 Maternal care for other known or suspected poor fetal growth, third trimester, not applicable or unspecified: Secondary | ICD-10-CM | POA: Diagnosis present

## 2023-12-05 DIAGNOSIS — Z23 Encounter for immunization: Secondary | ICD-10-CM | POA: Diagnosis not present

## 2023-12-05 DIAGNOSIS — Z3A37 37 weeks gestation of pregnancy: Secondary | ICD-10-CM

## 2023-12-05 DIAGNOSIS — O9962 Diseases of the digestive system complicating childbirth: Secondary | ICD-10-CM | POA: Diagnosis present

## 2023-12-05 DIAGNOSIS — K219 Gastro-esophageal reflux disease without esophagitis: Secondary | ICD-10-CM | POA: Diagnosis present

## 2023-12-05 DIAGNOSIS — D62 Acute posthemorrhagic anemia: Secondary | ICD-10-CM | POA: Diagnosis not present

## 2023-12-05 DIAGNOSIS — O9902 Anemia complicating childbirth: Secondary | ICD-10-CM | POA: Diagnosis present

## 2023-12-05 DIAGNOSIS — Z98891 History of uterine scar from previous surgery: Secondary | ICD-10-CM

## 2023-12-05 DIAGNOSIS — O365990 Maternal care for other known or suspected poor fetal growth, unspecified trimester, not applicable or unspecified: Principal | ICD-10-CM | POA: Diagnosis present

## 2023-12-05 DIAGNOSIS — O34211 Maternal care for low transverse scar from previous cesarean delivery: Secondary | ICD-10-CM | POA: Diagnosis present

## 2023-12-05 LAB — CBC
HCT: 33.6 % — ABNORMAL LOW (ref 36.0–46.0)
Hemoglobin: 10.7 g/dL — ABNORMAL LOW (ref 12.0–15.0)
MCH: 24.8 pg — ABNORMAL LOW (ref 26.0–34.0)
MCHC: 31.8 g/dL (ref 30.0–36.0)
MCV: 78 fL — ABNORMAL LOW (ref 80.0–100.0)
Platelets: 229 K/uL (ref 150–400)
RBC: 4.31 MIL/uL (ref 3.87–5.11)
RDW: 16.2 % — ABNORMAL HIGH (ref 11.5–15.5)
WBC: 6 K/uL (ref 4.0–10.5)
nRBC: 0 % (ref 0.0–0.2)

## 2023-12-05 LAB — TYPE AND SCREEN
ABO/RH(D): B POS
Antibody Screen: NEGATIVE

## 2023-12-05 SURGERY — Surgical Case
Anesthesia: Spinal

## 2023-12-05 MED ORDER — SODIUM CHLORIDE 0.9% FLUSH: 3.0000 mL | INTRAVENOUS | Status: DC | PRN

## 2023-12-05 MED ORDER — FENTANYL CITRATE (PF) 100 MCG/2ML IJ SOLN
25.0000 ug | INTRAMUSCULAR | Status: DC | PRN
  Administered 2023-12-05: 50 ug via INTRAVENOUS

## 2023-12-05 MED ORDER — SENNOSIDES-DOCUSATE SODIUM 8.6-50 MG PO TABS
2.0000 | ORAL_TABLET | Freq: Every day | ORAL | Status: DC
  Administered 2023-12-06 – 2023-12-08 (×3): 2 via ORAL
  Filled 2023-12-05 (×3): qty 2

## 2023-12-05 MED ORDER — COCONUT OIL OIL: 1.0000 | TOPICAL_OIL | Status: DC | PRN

## 2023-12-05 MED ORDER — PRENATAL MULTIVITAMIN CH
1.0000 | ORAL_TABLET | Freq: Every day | ORAL | Status: DC
  Administered 2023-12-06 – 2023-12-07 (×2): 1 via ORAL
  Filled 2023-12-05 (×2): qty 1

## 2023-12-05 MED ORDER — ACETAMINOPHEN 500 MG PO TABS
ORAL_TABLET | ORAL | Status: AC
  Filled 2023-12-05: qty 2

## 2023-12-05 MED ORDER — SIMETHICONE 80 MG PO CHEW
80.0000 mg | CHEWABLE_TABLET | Freq: Three times a day (TID) | ORAL | Status: DC
  Administered 2023-12-06 – 2023-12-08 (×5): 80 mg via ORAL
  Filled 2023-12-05 (×5): qty 1

## 2023-12-05 MED ORDER — DIPHENHYDRAMINE HCL 50 MG/ML IJ SOLN: 12.5000 mg | INTRAMUSCULAR | Status: DC | PRN

## 2023-12-05 MED ORDER — ONDANSETRON HCL 4 MG/2ML IJ SOLN: 4.0000 mg | Freq: Three times a day (TID) | INTRAMUSCULAR | Status: DC | PRN

## 2023-12-05 MED ORDER — CEFAZOLIN SODIUM-DEXTROSE 2-4 GM/100ML-% IV SOLN
2.0000 g | INTRAVENOUS | Status: AC
  Administered 2023-12-05: 2 g via INTRAVENOUS

## 2023-12-05 MED ORDER — KETOROLAC TROMETHAMINE 30 MG/ML IJ SOLN: 30.0000 mg | Freq: Four times a day (QID) | INTRAMUSCULAR | Status: AC | PRN

## 2023-12-05 MED ORDER — MEPERIDINE HCL 25 MG/ML IJ SOLN: 6.2500 mg | INTRAMUSCULAR | Status: DC | PRN

## 2023-12-05 MED ORDER — KETOROLAC TROMETHAMINE 30 MG/ML IJ SOLN
30.0000 mg | Freq: Four times a day (QID) | INTRAMUSCULAR | Status: AC | PRN
  Administered 2023-12-05: 30 mg via INTRAVENOUS

## 2023-12-05 MED ORDER — MORPHINE SULFATE (PF) 0.5 MG/ML IJ SOLN
INTRAMUSCULAR | Status: AC
  Filled 2023-12-05: qty 10

## 2023-12-05 MED ORDER — NALOXONE HCL 0.4 MG/ML IJ SOLN: 0.4000 mg | INTRAMUSCULAR | Status: DC | PRN

## 2023-12-05 MED ORDER — VITAMIN D 25 MCG (1000 UNIT) PO TABS
1000.0000 [IU] | ORAL_TABLET | Freq: Every day | ORAL | Status: DC
  Administered 2023-12-06 – 2023-12-08 (×3): 1000 [IU] via ORAL
  Filled 2023-12-05 (×3): qty 1

## 2023-12-05 MED ORDER — DIPHENHYDRAMINE HCL 25 MG PO CAPS
25.0000 mg | ORAL_CAPSULE | Freq: Four times a day (QID) | ORAL | Status: DC | PRN
  Administered 2023-12-06: 25 mg via ORAL
  Filled 2023-12-05: qty 1

## 2023-12-05 MED ORDER — SODIUM CHLORIDE 0.9 % IR SOLN
Status: DC | PRN
  Administered 2023-12-05: 1000 mL

## 2023-12-05 MED ORDER — NALOXONE HCL 4 MG/10ML IJ SOLN: 1.0000 ug/kg/h | INTRAVENOUS | Status: DC | PRN

## 2023-12-05 MED ORDER — IBUPROFEN 600 MG PO TABS
600.0000 mg | ORAL_TABLET | Freq: Four times a day (QID) | ORAL | Status: DC | PRN
  Administered 2023-12-06 – 2023-12-07 (×5): 600 mg via ORAL
  Filled 2023-12-05 (×5): qty 1

## 2023-12-05 MED ORDER — ACETAMINOPHEN 500 MG PO TABS
1000.0000 mg | ORAL_TABLET | Freq: Four times a day (QID) | ORAL | Status: DC
  Administered 2023-12-05 – 2023-12-08 (×10): 1000 mg via ORAL
  Filled 2023-12-05 (×10): qty 2

## 2023-12-05 MED ORDER — PHENYLEPHRINE 80 MCG/ML (10ML) SYRINGE FOR IV PUSH (FOR BLOOD PRESSURE SUPPORT): PREFILLED_SYRINGE | INTRAVENOUS | Status: DC | PRN

## 2023-12-05 MED ORDER — SOD CITRATE-CITRIC ACID 500-334 MG/5ML PO SOLN
30.0000 mL | ORAL | Status: AC
  Administered 2023-12-05: 30 mL via ORAL

## 2023-12-05 MED ORDER — ACETAMINOPHEN 500 MG PO TABS
1000.0000 mg | ORAL_TABLET | Freq: Four times a day (QID) | ORAL | Status: DC | PRN
  Administered 2023-12-05: 1000 mg via ORAL

## 2023-12-05 MED ORDER — DIPHENHYDRAMINE HCL 25 MG PO CAPS: 25.0000 mg | ORAL_CAPSULE | ORAL | Status: DC | PRN

## 2023-12-05 MED ORDER — ONDANSETRON HCL 4 MG/2ML IJ SOLN
INTRAMUSCULAR | Status: DC | PRN
  Administered 2023-12-05: 4 mg via INTRAVENOUS

## 2023-12-05 MED ORDER — DEXAMETHASONE SODIUM PHOSPHATE 10 MG/ML IJ SOLN
INTRAMUSCULAR | Status: DC | PRN
  Administered 2023-12-05: 10 mg via INTRAVENOUS

## 2023-12-05 MED ORDER — SCOPOLAMINE 1 MG/3DAYS TD PT72
MEDICATED_PATCH | TRANSDERMAL | Status: AC
  Filled 2023-12-05: qty 1

## 2023-12-05 MED ORDER — WITCH HAZEL-GLYCERIN EX PADS: 1.0000 | MEDICATED_PAD | CUTANEOUS | Status: DC | PRN

## 2023-12-05 MED ORDER — KETOROLAC TROMETHAMINE 30 MG/ML IJ SOLN
INTRAMUSCULAR | Status: AC
  Filled 2023-12-05: qty 1

## 2023-12-05 MED ORDER — POVIDONE-IODINE 10 % EX SWAB
2.0000 | Freq: Once | CUTANEOUS | Status: AC
  Administered 2023-12-05: 2 via TOPICAL

## 2023-12-05 MED ORDER — SIMETHICONE 80 MG PO CHEW: 80.0000 mg | CHEWABLE_TABLET | ORAL | Status: DC | PRN

## 2023-12-05 MED ORDER — MENTHOL 3 MG MT LOZG: 1.0000 | LOZENGE | OROMUCOSAL | Status: DC | PRN

## 2023-12-05 MED ORDER — PHENYLEPHRINE HCL-NACL 20-0.9 MG/250ML-% IV SOLN
INTRAVENOUS | Status: DC | PRN
  Administered 2023-12-05: 60 ug/min via INTRAVENOUS

## 2023-12-05 MED ORDER — MORPHINE SULFATE (PF) 0.5 MG/ML IJ SOLN
INTRAMUSCULAR | Status: DC | PRN
  Administered 2023-12-05: .15 mg via INTRATHECAL

## 2023-12-05 MED ORDER — LACTATED RINGERS IV SOLN: INTRAVENOUS | Status: DC

## 2023-12-05 MED ORDER — ZOLPIDEM TARTRATE 5 MG PO TABS: 5.0000 mg | ORAL_TABLET | Freq: Every evening | ORAL | Status: DC | PRN

## 2023-12-05 MED ORDER — OXYTOCIN-SODIUM CHLORIDE 30-0.9 UT/500ML-% IV SOLN: 2.5000 [IU]/h | INTRAVENOUS | Status: AC

## 2023-12-05 MED ORDER — DEXMEDETOMIDINE HCL IN NACL 80 MCG/20ML IV SOLN
INTRAVENOUS | Status: DC | PRN
  Administered 2023-12-05 (×2): 8 ug via INTRAVENOUS

## 2023-12-05 MED ORDER — SCOPOLAMINE 1 MG/3DAYS TD PT72
1.0000 | MEDICATED_PATCH | TRANSDERMAL | Status: DC
  Administered 2023-12-05: 1.5 mg via TRANSDERMAL

## 2023-12-05 MED ORDER — OXYTOCIN-SODIUM CHLORIDE 30-0.9 UT/500ML-% IV SOLN
INTRAVENOUS | Status: DC | PRN
  Administered 2023-12-05: 300 mL via INTRAVENOUS

## 2023-12-05 MED ORDER — SOD CITRATE-CITRIC ACID 500-334 MG/5ML PO SOLN
ORAL | Status: AC
Start: 2023-12-05 — End: 2023-12-05
  Filled 2023-12-05: qty 30

## 2023-12-05 MED ORDER — STERILE WATER FOR IRRIGATION IR SOLN
Status: DC | PRN
  Administered 2023-12-05: 1000 mL

## 2023-12-05 MED ORDER — CHLOROPROCAINE HCL (PF) 3 % IJ SOLN
INTRAMUSCULAR | Status: AC
Start: 2023-12-05 — End: 2023-12-05
  Filled 2023-12-05: qty 20

## 2023-12-05 MED ORDER — FENTANYL CITRATE (PF) 100 MCG/2ML IJ SOLN
INTRAMUSCULAR | Status: AC
Start: 2023-12-05 — End: 2023-12-05
  Filled 2023-12-05: qty 2

## 2023-12-05 MED ORDER — FENTANYL CITRATE (PF) 100 MCG/2ML IJ SOLN
INTRAMUSCULAR | Status: DC | PRN
  Administered 2023-12-05: 15 ug via INTRATHECAL

## 2023-12-05 MED ORDER — CEFAZOLIN SODIUM-DEXTROSE 2-4 GM/100ML-% IV SOLN
INTRAVENOUS | Status: AC
  Filled 2023-12-05: qty 100

## 2023-12-05 MED ORDER — DIBUCAINE (PERIANAL) 1 % EX OINT: 1.0000 | TOPICAL_OINTMENT | CUTANEOUS | Status: DC | PRN

## 2023-12-05 MED ORDER — BUPIVACAINE IN DEXTROSE 0.75-8.25 % IT SOLN
INTRATHECAL | Status: DC | PRN
  Administered 2023-12-05: 1.7 mL via INTRATHECAL

## 2023-12-05 MED ORDER — FENTANYL CITRATE (PF) 100 MCG/2ML IJ SOLN
INTRAMUSCULAR | Status: AC
  Filled 2023-12-05: qty 2

## 2023-12-05 MED ORDER — OXYCODONE HCL 5 MG PO TABS
5.0000 mg | ORAL_TABLET | ORAL | Status: DC | PRN
  Administered 2023-12-05 – 2023-12-08 (×5): 5 mg via ORAL
  Filled 2023-12-05 (×6): qty 1

## 2023-12-05 SURGICAL SUPPLY — 31 items
BENZOIN TINCTURE PRP APPL 2/3 (GAUZE/BANDAGES/DRESSINGS) ×1 IMPLANT
CHLORAPREP W/TINT 26 (MISCELLANEOUS) ×2 IMPLANT
CLAMP UMBILICAL CORD (MISCELLANEOUS) ×1 IMPLANT
CLOTH BEACON ORANGE TIMEOUT ST (SAFETY) ×1 IMPLANT
DERMABOND ADVANCED .7 DNX12 (GAUZE/BANDAGES/DRESSINGS) IMPLANT
DRAIN JACKSON PRT FLT 10 (DRAIN) IMPLANT
DRSG OPSITE POSTOP 4X10 (GAUZE/BANDAGES/DRESSINGS) ×1 IMPLANT
ELECTRODE REM PT RTRN 9FT ADLT (ELECTROSURGICAL) ×1 IMPLANT
EVACUATOR SILICONE 100CC (DRAIN) IMPLANT
EXTRACTOR VACUUM M CUP 4 TUBE (SUCTIONS) IMPLANT
GLOVE BIO SURGEON STRL SZ 6.5 (GLOVE) ×1 IMPLANT
GLOVE BIOGEL PI IND STRL 7.0 (GLOVE) ×2 IMPLANT
GOWN STRL REUS W/TWL LRG LVL3 (GOWN DISPOSABLE) ×2 IMPLANT
KIT ABG SYR 3ML LUER SLIP (SYRINGE) IMPLANT
NDL HYPO 25X5/8 SAFETYGLIDE (NEEDLE) IMPLANT
NEEDLE HYPO 25X5/8 SAFETYGLIDE (NEEDLE) IMPLANT
NS IRRIG 1000ML POUR BTL (IV SOLUTION) ×1 IMPLANT
PACK C SECTION WH (CUSTOM PROCEDURE TRAY) ×1 IMPLANT
PAD OB MATERNITY 4.3X12.25 (PERSONAL CARE ITEMS) ×1 IMPLANT
RTRCTR C-SECT PINK 25CM LRG (MISCELLANEOUS) IMPLANT
STRIP CLOSURE SKIN 1/2X4 (GAUZE/BANDAGES/DRESSINGS) ×1 IMPLANT
SUT CHROMIC 0 CT 1 (SUTURE) ×1 IMPLANT
SUT MNCRL AB 3-0 PS2 27 (SUTURE) ×1 IMPLANT
SUT PLAIN 2 0 XLH (SUTURE) ×1 IMPLANT
SUT PLAIN ABS 2-0 CT1 27XMFL (SUTURE) ×2 IMPLANT
SUT SILK 2 0 SH (SUTURE) IMPLANT
SUT VIC AB 0 CTX36XBRD ANBCTRL (SUTURE) ×4 IMPLANT
SUT VIC AB 2-0 SH 27XBRD (SUTURE) IMPLANT
TOWEL OR 17X24 6PK STRL BLUE (TOWEL DISPOSABLE) ×1 IMPLANT
TRAY FOLEY W/BAG SLVR 14FR LF (SET/KITS/TRAYS/PACK) ×1 IMPLANT
WATER STERILE IRR 1000ML POUR (IV SOLUTION) ×1 IMPLANT

## 2023-12-05 NOTE — Transfer of Care (Signed)
 Immediate Anesthesia Transfer of Care Note  Patient: Deborah Santiago  Procedure(s) Performed: CESAREAN DELIVERY  Patient Location: PACU  Anesthesia Type:Spinal  Level of Consciousness: awake, alert , and oriented  Airway & Oxygen Therapy: Patient Spontanous Breathing  Post-op Assessment: Report given to RN and Post -op Vital signs reviewed and stable  Post vital signs: Reviewed and stable  Last Vitals:  Vitals Value Taken Time  BP 112/72 12/05/23 18:23  Temp    Pulse 66 12/05/23 18:24  Resp 21 12/05/23 18:24  SpO2 99 % 12/05/23 18:24  Vitals shown include unfiled device data.  Last Pain:  Vitals:   12/05/23 1524  TempSrc:   PainSc: 0-No pain         Complications: No notable events documented.

## 2023-12-05 NOTE — Op Note (Signed)
 Cesarean Section Procedure Note   [name]  12/05/2023  Indications: IUGR AT 37 WEEKS  H/O CS TIMES TWO  Pre-operative Diagnosis: Repeat cesarean section due to intrauterine fetal growth restriction.   Post-operative Diagnosis: Same   Surgeon: Surgeons and Role:    * Armond Cape, MD - Primary    * Henry Slough, MD ASSITANT WAS NEEDED FOR THE COMPLEXITY OF THE PROCEDURE  Assistants: DR  HENRY MD   Anesthesia: spinal   Procedure Details:  The patient was seen in the Holding Room. The risks, benefits, complications, treatment options, and expected outcomes were discussed with the patient. The patient concurred with the proposed plan, giving informed consent. identified as Deborah Santiago and the procedure verified as C-Section Delivery. A Time Out was held and the above information confirmed.  After induction of anesthesia, the patient was draped and prepped in the usual sterile manner. A transverse incision was made and carried down through the subcutaneous tissue to the fascia. Fascial incision was made in the midline and extended transversely. The fascia was separated from the underlying rectus muscle superiorly and inferiorly. The peritoneum was identified and entered. Peritoneal incision was extended longitudinally with good visualization of bowel and bladder. The utero-vesical peritoneal reflection was incised transversely and the bladder flap was bluntly freed from the lower uterine segment.  An alexsis retractor was placed in the abdomen.   A low transverse uterine incision was made. Delivered from cephalic presentation was a  infant, with Apgar scores of 7 at one minute and 7 at five minutes. Cord ph was not sent the umbilical cord was clamped and cut cord blood was obtained for evaluation. The placenta was removed Intact and appeared normal. The uterine outline, tubes and ovaries appeared normal}. The uterine incision was closed with running locked sutures of  0Vicryl. A second layer 0 vicrlyl was used to imbricate the uterine incision    Hemostasis was observed. Lavage was carried out until clear. The alexsis was removed.  The peritoneum was closed with 0 chromic.  The muscles were examined and any bleeders were made hemostatic using bovie cautery device.   The fascia was then reapproximated with running sutures of 0 vicryl.  The subcutaneous tissue was reapproximated  With interrupted stitches using 2-0 plain gut. The subcuticular closure was performed using 3-87monocryl     Instrument, sponge, and needle counts were correct prior the abdominal closure and were correct at the conclusion of the case.    Findings: infant was delivered from VTX presentation. The fluid was CLEAR.  The uterus tubes and ovaries appeared normal.     Estimated Blood Loss: 354 CC   Total IV Fluids:   Urine Output: 225CC OF clear urine  Specimens: PLACENTA TO PATH  Complications: no complications  Disposition: PACU - hemodynamically stable.   Maternal Condition: stable   Baby condition / location:  NICU  Attending Attestation: I was present and scrubbed for the entire procedure.   Signed: Surgeon(s): Henry Slough, MD Armond Cape, MD

## 2023-12-05 NOTE — Anesthesia Postprocedure Evaluation (Signed)
 Anesthesia Post Note  Patient: Deborah Santiago  Procedure(s) Performed: CESAREAN DELIVERY     Patient location during evaluation: PACU Anesthesia Type: Spinal Level of consciousness: oriented and awake and alert Pain management: pain level controlled Vital Signs Assessment: post-procedure vital signs reviewed and stable Respiratory status: spontaneous breathing, respiratory function stable and patient connected to nasal cannula oxygen Cardiovascular status: blood pressure returned to baseline and stable Postop Assessment: no headache, no backache and no apparent nausea or vomiting Anesthetic complications: no   No notable events documented.  Last Vitals:  Vitals:   12/05/23 1915 12/05/23 1951  BP: 109/65 112/68  Pulse: 66 65  Resp: 17   Temp: 36.5 C 36.6 C  SpO2: 100% 100%    Last Pain:  Vitals:   12/05/23 1951  TempSrc: Oral  PainSc: 7    Pain Goal:                   Kerria Sapien L Jennylee Uehara

## 2023-12-05 NOTE — H&P (Signed)
 Deborah Santiago is a 23 y.o. female presenting for repeat cesarean section at 37 weeks.  Infant has IUGR growth at 2%.  Normal dopplers.   Pt has had two prior CS. OB History     Gravida  3   Para  2   Term  1   Preterm  1   AB      Living  2      SAB      IAB      Ectopic      Multiple  0   Live Births  2          Past Medical History:  Diagnosis Date   Encounter for induction of labor 08/10/2019   History of HELLP syndrome, currently pregnant    History of pre-eclampsia    Maternal anemia, with delivery 08/11/2019   Postpartum care following cesarean delivery 3/19 08/11/2019   Vaginal Pap smear, abnormal    Past Surgical History:  Procedure Laterality Date   CESAREAN SECTION N/A 10/28/2015   Procedure: CESAREAN SECTION;  Surgeon: Nathanel Bunker, MD;  Location: [facility];  Service: Obstetrics;  Laterality: N/A;   CESAREAN SECTION N/A 08/10/2019   Procedure: CESAREAN SECTION;  Surgeon: Bettina Muskrat, MD;  Location: [facility];  Service: Obstetrics;  Laterality: N/A;   IUD REMOVAL N/A 03/12/2022   Procedure: PARAGARD INTRAUTERINE DEVICE (IUD) REMOVAL;  Surgeon: Henry Slough, MD;  Location: [facility];  Service: Gynecology;  Laterality: N/A;   LABIOPLASTY N/A 03/12/2022   Procedure: LABIAPLASTY;  Surgeon: Henry Slough, MD;  Location: [facility];  Service: Gynecology;  Laterality: N/A;   LEEP N/A 03/12/2022   Procedure: LOOP ELECTROSURGICAL EXCISION PROCEDURE (LEEP);  Surgeon: Henry Slough, MD;  Location: [facility];  Service: Gynecology;  Laterality: N/A;   WISDOM TOOTH EXTRACTION     Family History: family history includes Hypertension in her mother. Social History:  reports that she has never smoked. She has never used smokeless tobacco. She reports that she does not currently use alcohol. She reports that she does not use drugs.     Maternal Diabetes: No Genetic Screening: Normal Maternal  Ultrasounds/Referrals: IUGR Fetal Ultrasounds or other Referrals:  Referred to Materal Fetal Medicine  Maternal Substance Abuse:  No Significant Maternal Medications:  None Significant Maternal Lab Results:  Group B Strep negative Number of Prenatal Visits:greater than 3 verified prenatal visits Maternal Vaccinations:TDap Other Comments:  None  Review of Systems History   Blood pressure 113/73, pulse 93, temperature 98.4 F (36.9 C), temperature source Oral, resp. rate 17, height 5' 3 (1.6 m), weight 75.1 kg, last menstrual period 03/21/2023, SpO2 99%, not currently breastfeeding. Exam Physical Exam  Physical Examination: General appearance - alert, well appearing, and in no distress Chest - clear to auscultation, no wheezes, rales or rhonchi, symmetric air entry Heart - normal rate and regular rhythm Abdomen - soft, nontender, nondistended, no masses or organomegaly gravid Extremities - Homan's sign negative bilaterally  Prenatal labs: ABO, Rh: --/--/B POS (07/11 1121) Antibody: NEG (07/11 1121) Rubella: Nonimmune (01/02 0000) RPR: NON REACTIVE (07/11 1200)  HBsAg: Negative (01/02 0000)  HIV: Non-reactive (01/02 0000)  GBS:   neg  Assessment/Plan: IUP at 37 weeks IUGR  H/O CS TIMES TWO R&B REVIEWED    Randle Shatzer A Kaylen Nghiem 12/05/2023, 3:46 PM

## 2023-12-05 NOTE — Anesthesia Procedure Notes (Signed)
 Spinal  Patient location during procedure: OR Start time: 12/05/2023 4:56 PM End time: 12/05/2023 5:00 PM Reason for block: surgical anesthesia Staffing Performed: anesthesiologist  Anesthesiologist: Jerrye Sharper, MD Performed by: Jerrye Sharper, MD Authorized by: Jerrye Sharper, MD   Preanesthetic Checklist Completed: patient identified, IV checked, site marked, risks and benefits discussed, surgical consent, monitors and equipment checked, pre-op evaluation and timeout performed Spinal Block Patient position: sitting Prep: DuraPrep and site prepped and draped Patient monitoring: heart rate, cardiac monitor, continuous pulse ox and blood pressure Approach: midline Location: L3-4 Injection technique: single-shot Needle Needle type: Pencan  Needle gauge: 24 G Needle length: 9 cm Needle insertion depth: 6 cm Assessment Sensory level: T4 Events: CSF return Additional Notes Patient tolerated procedure well. Adequate sensory level.

## 2023-12-06 LAB — CBC
HCT: 29.8 % — ABNORMAL LOW (ref 36.0–46.0)
Hemoglobin: 9.7 g/dL — ABNORMAL LOW (ref 12.0–15.0)
MCH: 25.2 pg — ABNORMAL LOW (ref 26.0–34.0)
MCHC: 32.6 g/dL (ref 30.0–36.0)
MCV: 77.4 fL — ABNORMAL LOW (ref 80.0–100.0)
Platelets: 233 K/uL (ref 150–400)
RBC: 3.85 MIL/uL — ABNORMAL LOW (ref 3.87–5.11)
RDW: 15.9 % — ABNORMAL HIGH (ref 11.5–15.5)
WBC: 10.5 K/uL (ref 4.0–10.5)
nRBC: 0 % (ref 0.0–0.2)

## 2023-12-06 LAB — RPR: RPR Ser Ql: NONREACTIVE

## 2023-12-06 MED ORDER — FERROUS SULFATE 325 (65 FE) MG PO TABS
325.0000 mg | ORAL_TABLET | ORAL | Status: DC
  Administered 2023-12-06 – 2023-12-08 (×2): 325 mg via ORAL
  Filled 2023-12-06 (×2): qty 1

## 2023-12-06 MED ORDER — FERROUS SULFATE 325 (65 FE) MG PO TBEC
325.0000 mg | DELAYED_RELEASE_TABLET | ORAL | Status: DC
  Filled 2023-12-06: qty 1

## 2023-12-06 NOTE — Lactation Note (Signed)
 This note was copied from a baby's chart. Lactation Consultation Note  Patient Name: Deborah Santiago Unijb'd Date: 12/06/2023 Age:25 hours Reason for consult: Initial assessment;Early term 37-38.6wks;NICU baby Experienced BF mom welcomed pumping. LC set up DEBP for mom and got her pumping. Mom shown how to use DEBP & how to disassemble, clean, & reassemble parts. Mom knows to pump q 3hrs. Reviewed Milk storage for NICU.  Mom pumping when LC left. Mom doesn't have any questions at this time. Mom doesn't have a pump but is going to get one. Encouraged to call for assistance or questions. Maternal Data Does the patient have breastfeeding experience prior to this delivery?: Yes How long did the patient breastfeed?: 1 1/2 yrs to her now 25 yr old  Feeding    LATCH Score       Type of Nipple: Everted at rest and after stimulation  Comfort (Breast/Nipple): Soft / non-tender         Lactation Tools Discussed/Used Tools: Pump;Flanges Flange Size: 24 Breast pump type: Double-Electric Breast Pump Pump Education: Setup, frequency, and cleaning;Milk Storage Reason for Pumping: NICU baby Pumping frequency: q 3hr  Interventions Interventions: DEBP;Education;LC Services brochure;CDC milk storage guidelines  Discharge    Consult Status Consult Status: NICU follow-up Date: 12/06/23 Follow-up type: In-patient    Johnanna Bakke G 12/06/2023, 12:10 AM

## 2023-12-06 NOTE — Lactation Note (Signed)
 This note was copied from a baby's chart.  NICU Lactation Consultation Note  Patient Name: Girl Deborah Santiago Unijb'd Date: 12/06/2023 Age:25 hours  Reason for consult: Follow-up assessment; NICU baby; Early term 61-38.6wks; Infant < 6lbs; Other (Comment) (IUGR)  SUBJECTIVE  LC in to visit with P3 Mom of ET infant in the NICU.  Baby is now on room air and just drank her first bottle as ad lib feeding.    Mom has been pumping, but last time she only used the hand pump.  LC offered to make it easier.  LC provided a pumping top and assisted Mom to pump using 21 mm flanges for a good fit.  Educated Mom on the importance of consistent pumping to support her milk supply.  Mom has milk labels and storage bottles.    LC encouraged Mom to take her pump parts to baby's room when she is discharged from [facility].  LC reviewed importance of disassembling all the pump parts, washing, rinsing and air drying on clean paper towel.  Mom desires to do both breastfeeding and pumping and bottle feeding.  Mom doesn't have a pump at home.  Mom doesn't have WIC, but she plans to sign up again.  LC sent referral to [facility].  Parents told about the [facility] loaner she could get on discharge for 10 days for a $30 refundable deposit.  Mom to ask for [facility] on discharge.  OBJECTIVE Infant data: Mother's Current Feeding Choice: Breast Milk and Formula  O2 Device: Room Air FiO2 (%): 21 %  Infant feeding assessment IDFTS - Readiness: 2 IDFTS - Quality: 2   Maternal data: [id number] C-Section, Low Transverse Hand Expression Comments: mom knows how Significant Breast History:: ++ breast changes Does the patient have breastfeeding experience prior to this delivery?: Yes How long did the patient breastfeed?: 1 1/2 yrs to her now 25 yr old Pumping frequency: every 3 hrs Pumped volume: 1 mL Flange Size: 21 Risk factor for low/delayed milk supply:: infant separation  WIC Program: Yes WIC Referral Sent?: Yes What county?:  [place]  ASSESSMENT Infant:  Feeding Status: Ad lib Feeding method: Bottle Nipple Type: Nfant Standard Flow (white)  Maternal: Milk volume: Normal  INTERVENTIONS/PLAN Interventions: Interventions: Breast feeding basics reviewed; Skin to skin; Breast massage; Hand express; DEBP; Education; CDC Guidelines for Breast Pump Cleaning Tools: Pump; Flanges; Hands-free pumping top Pump Education: Setup, frequency, and cleaning; Milk Storage  Plan: Consult Status: NICU follow-up NICU Follow-up type: Verify onset of copious milk; Verify absence of engorgement; Maternal D/C visit; Verify DEBP issuance   Claudene Aleck BRAVO 12/06/2023, 3:28 PM

## 2023-12-06 NOTE — Progress Notes (Addendum)
 Subjective: Postpartum Day 1: Cesarean Delivery Patient reports tolerating PO and no problems voiding.  Ambulating without difficulty.  Reports baby in NICU but overall doing well.  Objective: Vital signs in last 24 hours: Temp:  [97 F (36.1 C)-98.4 F (36.9 C)] 97.6 F (36.4 C) (07/15 1148) Pulse Rate:  [65-79] 79 (07/15 1148) Resp:  [16-22] 16 (07/15 1148) BP: (102-113)/(58-73) 110/61 (07/15 1148) SpO2:  [96 %-100 %] 99 % (07/15 1148)  Physical Exam:  General: alert, cooperative, and no distress Lochia: appropriate Uterine Fundus: FF, NT Incision: dressing intact with stable staining DVT Evaluation: no calf tenderness, neg edema  Recent Labs    12/05/23 1516 12/06/23 0546  HGB 10.7* 9.7*  HCT 33.6* 29.8*    Assessment/Plan: Status post Cesarean section. Doing well postoperatively.  Continue current care. Routine post op care Asymptomatic anemia - iron ordered SCDs for DVT prophylaxis Plans breast feeding Will order dressing change  Jon CINDERELLA Rummer, MD 12/06/2023, 3:14 PM

## 2023-12-06 NOTE — Progress Notes (Signed)

## 2023-12-07 NOTE — Lactation Note (Addendum)
 This note was copied from a baby's chart. Lactation Consultation Note  Patient Name: Deborah Santiago Unijb'd Date: 12/07/2023 Age:25 hours Reason for consult: Follow-up assessment;Early term 37-38.6wks;Infant < 6lbs  P3- MOB reports that her milk has started to transition in. MOB reports seeing a lot more volume when pumping. LC requested to assess MOB's breasts to ensure there is no engorgement. MOB agreed. LC palpated MOB's breasts and noted that they were filling, but not full. No engorgement noted at this time. LC reviewed engorgement protocol with MOB. LC asked MOB if she would like the [facility] loaner pump. MOB stated that she did want it, but did not have the money. LC encouraged MOB to call out when FOB brings that $30 for the pump. MOB agreed. MOB requested for outpatient support once discharged. LC sent referral to outpatient team.  Maternal Data Has patient been taught Hand Expression?: Yes Does the patient have breastfeeding experience prior to this delivery?: Yes  Feeding Mother's Current Feeding Choice: Breast Milk and Formula  Lactation Tools Discussed/Used Tools: Pump;Flanges Flange Size: 21 Breast pump type: Double-Electric Breast Pump;Manual Pump Education: Setup, frequency, and cleaning;Milk Storage Pumping frequency: 15 min every 3 hrs  Interventions Interventions: Breast feeding basics reviewed;Hand pump;DEBP;Education;LC Services brochure;CDC milk storage guidelines  Discharge Discharge Education: Engorgement and breast care;Warning signs for feeding baby;Outpatient recommendation;Outpatient Epic message sent [facility] Program: Yes  Consult Status Consult Status: Complete Date: 12/07/23    Recardo Hoit BS, IBCLC 12/07/2023, 4:49 PM

## 2023-12-07 NOTE — Plan of Care (Signed)

## 2023-12-07 NOTE — Progress Notes (Signed)
 Post Partum Day 2 Subjective: no complaints, up ad lib, voiding, and tolerating PO  Objective: Blood pressure 106/61, pulse 86, temperature 98.3 F (36.8 C), temperature source Oral, resp. rate 16, height 5' 3 (1.6 m), weight 75.1 kg, last menstrual period 03/21/2023, SpO2 100%, currently breastfeeding.  Physical Exam:  General: alert and cooperative Lochia: appropriate Uterine Fundus: firm Incision: healing well, no significant drainage DVT Evaluation: Negative Homan's sign.  Recent Labs    12/05/23 1516 12/06/23 0546  HGB 10.7* 9.7*  HCT 33.6* 29.8*    Assessment/Plan: Plan for discharge tomorrow   LOS: 2 days   Ovid DELENA All, MD 12/07/2023, 9:23 PM

## 2023-12-08 ENCOUNTER — Encounter (HOSPITAL_COMMUNITY): Payer: Self-pay | Admitting: Obstetrics and Gynecology

## 2023-12-08 LAB — SURGICAL PATHOLOGY

## 2023-12-08 MED ORDER — MEASLES, MUMPS & RUBELLA VAC IJ SOLR
0.5000 mL | Freq: Once | INTRAMUSCULAR | Status: AC
  Administered 2023-12-08: 0.5 mL via SUBCUTANEOUS
  Filled 2023-12-08: qty 0.5

## 2023-12-08 MED ORDER — IBUPROFEN 600 MG PO TABS: 600.0000 mg | ORAL_TABLET | Freq: Four times a day (QID) | ORAL | 1 refills | Status: AC | PRN

## 2023-12-08 MED ORDER — OXYCODONE HCL 5 MG PO TABS: 5.0000 mg | ORAL_TABLET | Freq: Four times a day (QID) | ORAL | 0 refills | Status: AC | PRN

## 2023-12-08 NOTE — Plan of Care (Signed)
  Problem: Education: Goal: Knowledge of General Education information will improve Description: Including pain rating scale, medication(s)/side effects and non-pharmacologic comfort measures Outcome: Completed/Met   Problem: Health Behavior/Discharge Planning: Goal: Ability to manage health-related needs will improve Outcome: Completed/Met   Problem: Clinical Measurements: Goal: Ability to maintain clinical measurements within normal limits will improve Outcome: Completed/Met Goal: Will remain free from infection Outcome: Completed/Met Goal: Diagnostic test results will improve Outcome: Completed/Met Goal: Respiratory complications will improve Outcome: Completed/Met Goal: Cardiovascular complication will be avoided Outcome: Completed/Met   Problem: Activity: Goal: Risk for activity intolerance will decrease Outcome: Completed/Met   Problem: Nutrition: Goal: Adequate nutrition will be maintained Outcome: Completed/Met   Problem: Coping: Goal: Level of anxiety will decrease Outcome: Completed/Met   Problem: Elimination: Goal: Will not experience complications related to bowel motility Outcome: Completed/Met Goal: Will not experience complications related to urinary retention Outcome: Completed/Met   Problem: Pain Managment: Goal: General experience of comfort will improve and/or be controlled Outcome: Completed/Met   Problem: Safety: Goal: Ability to remain free from injury will improve Outcome: Completed/Met   Problem: Skin Integrity: Goal: Risk for impaired skin integrity will decrease Outcome: Completed/Met   Problem: Education: Goal: Knowledge of the prescribed therapeutic regimen will improve Outcome: Completed/Met Goal: Understanding of sexual limitations or changes related to disease process or condition will improve Outcome: Completed/Met Goal: Individualized Educational Video(s) Outcome: Completed/Met   Problem: Self-Concept: Goal: Communication of  feelings regarding changes in body function or appearance will improve Outcome: Completed/Met   Problem: Skin Integrity: Goal: Demonstration of wound healing without infection will improve Outcome: Completed/Met   Problem: Education: Goal: Knowledge of condition will improve Outcome: Completed/Met Goal: Individualized Educational Video(s) Outcome: Completed/Met Goal: Individualized Newborn Educational Video(s) Outcome: Completed/Met   Problem: Activity: Goal: Will verbalize the importance of balancing activity with adequate rest periods Outcome: Completed/Met Goal: Ability to tolerate increased activity will improve Outcome: Completed/Met   Problem: Coping: Goal: Ability to identify and utilize available resources and services will improve Outcome: Completed/Met   Problem: Life Cycle: Goal: Chance of risk for complications during the postpartum period will decrease Outcome: Completed/Met   Problem: Role Relationship: Goal: Ability to demonstrate positive interaction with newborn will improve Outcome: Completed/Met   Problem: Skin Integrity: Goal: Demonstration of wound healing without infection will improve Outcome: Completed/Met

## 2023-12-08 NOTE — Discharge Summary (Signed)
 Postpartum Discharge Summary  Date of Service updated  12-08-23     Patient Name: Deborah Santiago DOB: Oct 19, 2000 MRN: [id number]  Date of admission: 12/05/2023 Delivery date:12/05/2023 Delivering provider: ARMOND CAPE Date of discharge: 12/08/2023  Admitting diagnosis: Poor fetal growth affecting management of mother in third trimester, single or unspecified fetus [O36.5930] IUGR (intrauterine growth restriction) affecting care of mother [O36.5990] S/P cesarean section [Z98.891] Intrauterine pregnancy: [redacted]w[redacted]d     Secondary diagnosis:  Principal Problem:   IUGR (intrauterine growth restriction) affecting care of mother Active Problems:   S/P cesarean section  Additional problems: asymptomatic anemia    Discharge diagnosis: Term Pregnancy Delivered                                              Post partum procedures:MMR vaccine Augmentation: N/A Complications: None  Hospital course: Sceduled C/S   23 y.o. yo G3P2103 at [redacted]w[redacted]d was admitted to the hospital 12/05/2023 for scheduled cesarean section with the following indication:Elective Repeat.Delivery details are as follows:  Membrane Rupture Time/Date: 5:37 PM,12/05/2023  Delivery Method:C-Section, Low Transverse Operative Delivery:N/A Details of operation can be found in separate operative note.  Patient had a postpartum course complicated by nothing.  She is ambulating, tolerating a regular diet, passing flatus, and urinating well. Patient is discharged home in stable condition on  12/08/23        Newborn Data: Birth date:12/05/2023 Birth time:5:37 PM Gender:Female Living status:Living Apgars:7 ,7  Weight:2540 g    Magnesium  Sulfate received: No BMZ received: No Rhophylac:No MMR:Yes  Immunizations administered: There is no immunization history for the selected administration types on file for this patient.  Physical exam  Vitals:   12/07/23 1715 12/07/23 1932 12/08/23 0439 12/08/23 0830  BP: 114/64 106/61 106/63  109/73  Pulse: 75 86 82 80  Resp: 16 16 16 18   Temp: 98.4 F (36.9 C) 98.3 F (36.8 C) 98.7 F (37.1 C) 98.2 F (36.8 C)  TempSrc: Oral Oral Oral Oral  SpO2: 100%  100% 100%  Weight:      Height:       General: alert, cooperative, and no distress Lochia: appropriate Uterine Fundus: FF, NT Incision: dressing c/d/i DVT Evaluation: no calf tenderness, neg edema  Labs: Lab Results  Component Value Date   WBC 10.5 12/06/2023   HGB 9.7 (L) 12/06/2023   HCT 29.8 (L) 12/06/2023   MCV 77.4 (L) 12/06/2023   PLT 233 12/06/2023      Latest Ref Rng & Units 12/02/2023   12:00 PM  CMP  Glucose 70 - 99 mg/dL 68   BUN 6 - 20 mg/dL 6   Creatinine 9.55 - 8.99 mg/dL 9.38   Sodium 864 - 854 mmol/L 135   Potassium 3.5 - 5.1 mmol/L 3.8   Chloride 98 - 111 mmol/L 105   CO2 22 - 32 mmol/L 20   Calcium 8.9 - 10.3 mg/dL 8.8    Edinburgh Score:    12/07/2023   10:43 AM  Edinburgh Postnatal Depression Scale Screening Tool  I have been able to laugh and see the funny side of things. 0  I have looked forward with enjoyment to things. 0  I have blamed myself unnecessarily when things went wrong. 1  I have been anxious or worried for no good reason. 0  I have felt scared or panicky for no good  reason. 0  Things have been getting on top of me. 0  I have been so unhappy that I have had difficulty sleeping. 0  I have felt sad or miserable. 0  I have been so unhappy that I have been crying. 0  The thought of harming myself has occurred to me. 0  Edinburgh Postnatal Depression Scale Total 1      After visit meds:  Allergies as of 12/08/2023   No Known Allergies      Medication List     TAKE these medications    cholecalciferol  25 MCG (1000 UNIT) tablet Commonly known as: VITAMIN D3 Take 1,000 Units by mouth daily.   FeroSul 325 (65 Fe) MG tablet Generic drug: ferrous sulfate  Take 325 mg by mouth 2 (two) times daily.   ibuprofen  600 MG tablet Commonly known as: ADVIL  Take 1  tablet (600 mg total) by mouth every 6 (six) hours as needed for mild pain (pain score 1-3), moderate pain (pain score 4-6) or cramping.   oxyCODONE  5 MG immediate release tablet Commonly known as: Oxy IR/ROXICODONE  Take 1 tablet (5 mg total) by mouth every 6 (six) hours as needed for moderate pain (pain score 4-6), breakthrough pain or severe pain (pain score 7-10).   prenatal vitamin w/FE, FA 27-1 MG Tabs tablet Take 1 tablet by mouth daily at 12 noon.         Discharge home in stable condition Infant Feeding: Bottle and plans to see lactation consultant out pt Infant Disposition:home with mother Discharge instruction: per After Visit Summary and Postpartum booklet. Activity: Advance as tolerated. Pelvic rest for 6 weeks.  Diet: routine diet Anticipated Birth Control: Unsure Postpartum Appointment:6 weeks Additional Postpartum F/U: n/a Future Appointments:No future appointments. Follow up Visit:  Follow-up Information     [facility]. Schedule an appointment as soon as possible for a visit in 6 week(s).   Specialty: Obstetrics and Gynecology Why: For Postpartum follow-up Contact information: [address] [place]  [address] [phone]                    12/08/2023 Jon CINDERELLA Rummer, MD

## 2023-12-20 ENCOUNTER — Telehealth (HOSPITAL_COMMUNITY): Payer: Self-pay | Admitting: *Deleted

## 2023-12-20 NOTE — Telephone Encounter (Signed)
 12/20/2023  Name: LETICA GIAIMO MRN: [id number] DOB: September 13, 2000  Reason for Call:  Transition of Care Hospital Discharge Call  Contact Status: Patient Contact Status: Message  Language assistant needed:          Follow-Up Questions:    Van Postnatal Depression Scale:  In the Past 7 Days:    PHQ2-9 Depression Scale:     Discharge Follow-up:    Post-discharge interventions: NA  Mliss Sieve, RN 12/20/2023 15:21

## 2024-04-06 ENCOUNTER — Emergency Department (HOSPITAL_COMMUNITY)

## 2024-04-06 ENCOUNTER — Encounter (HOSPITAL_COMMUNITY): Payer: Self-pay | Admitting: *Deleted

## 2024-04-06 ENCOUNTER — Emergency Department (HOSPITAL_COMMUNITY)
Admission: EM | Admit: 2024-04-06 | Discharge: 2024-04-07 | Discharge status: Against Medical Advice | Attending: Emergency Medicine | Admitting: Emergency Medicine

## 2024-04-06 ENCOUNTER — Other Ambulatory Visit: Payer: Self-pay

## 2024-04-06 DIAGNOSIS — R079 Chest pain, unspecified: Secondary | ICD-10-CM | POA: Insufficient documentation

## 2024-04-06 DIAGNOSIS — R519 Headache, unspecified: Secondary | ICD-10-CM | POA: Diagnosis present

## 2024-04-06 DIAGNOSIS — M25562 Pain in left knee: Secondary | ICD-10-CM | POA: Insufficient documentation

## 2024-04-06 DIAGNOSIS — Z5321 Procedure and treatment not carried out due to patient leaving prior to being seen by health care provider: Secondary | ICD-10-CM | POA: Insufficient documentation

## 2024-04-06 LAB — POC URINE PREG, ED: Preg Test, Ur: NEGATIVE

## 2024-04-06 NOTE — ED Notes (Signed)
 No answer when called by xray  was seen going out the door by the staff there
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
## 2024-04-06 NOTE — ED Notes (Signed)
Pt called for vitals with no answer.
